# Patient Record
Sex: Female | Born: 1970 | Race: White | Hispanic: No | Marital: Married | Smoking: Current some day smoker
Health system: Southern US, Community
[De-identification: ages and names within clinical notes are randomized; demographics above are authoritative.]

## PROBLEM LIST (undated history)

## (undated) DIAGNOSIS — E785 Hyperlipidemia, unspecified: Secondary | ICD-10-CM

## (undated) DIAGNOSIS — E079 Disorder of thyroid, unspecified: Secondary | ICD-10-CM

## (undated) DIAGNOSIS — N6019 Diffuse cystic mastopathy of unspecified breast: Secondary | ICD-10-CM

## (undated) DIAGNOSIS — T7840XA Allergy, unspecified, initial encounter: Secondary | ICD-10-CM

## (undated) DIAGNOSIS — M94261 Chondromalacia, right knee: Secondary | ICD-10-CM

## (undated) DIAGNOSIS — F419 Anxiety disorder, unspecified: Secondary | ICD-10-CM

## (undated) HISTORY — DX: Chondromalacia, right knee: M94.261

## (undated) HISTORY — DX: Allergy, unspecified, initial encounter: T78.40XA

## (undated) HISTORY — DX: Anxiety disorder, unspecified: F41.9

## (undated) HISTORY — PX: TONSILLECTOMY AND ADENOIDECTOMY: SUR1326

## (undated) HISTORY — PX: ESSURE TUBAL LIGATION: SUR464

## (undated) HISTORY — DX: Diffuse cystic mastopathy of unspecified breast: N60.19

## (undated) HISTORY — DX: Hyperlipidemia, unspecified: E78.5

## (undated) HISTORY — DX: Disorder of thyroid, unspecified: E07.9

---

## 1997-05-20 ENCOUNTER — Other Ambulatory Visit: Admission: RE | Admit: 1997-05-20 | Discharge: 1997-05-20 | Payer: Self-pay | Admitting: Obstetrics and Gynecology

## 1998-05-26 ENCOUNTER — Other Ambulatory Visit: Admission: RE | Admit: 1998-05-26 | Discharge: 1998-05-26 | Payer: Self-pay | Admitting: Obstetrics and Gynecology

## 1999-03-30 ENCOUNTER — Encounter: Payer: Self-pay | Admitting: *Deleted

## 1999-03-30 ENCOUNTER — Encounter: Admission: RE | Admit: 1999-03-30 | Discharge: 1999-03-30 | Payer: Self-pay | Admitting: *Deleted

## 1999-05-25 ENCOUNTER — Other Ambulatory Visit: Admission: RE | Admit: 1999-05-25 | Discharge: 1999-05-25 | Payer: Self-pay | Admitting: Obstetrics and Gynecology

## 1999-12-11 ENCOUNTER — Inpatient Hospital Stay (HOSPITAL_COMMUNITY): Admission: AD | Admit: 1999-12-11 | Discharge: 1999-12-11 | Payer: Self-pay | Admitting: Physical Therapy

## 1999-12-16 ENCOUNTER — Inpatient Hospital Stay (HOSPITAL_COMMUNITY): Admission: AD | Admit: 1999-12-16 | Discharge: 1999-12-18 | Payer: Self-pay | Admitting: Obstetrics and Gynecology

## 2000-01-27 ENCOUNTER — Other Ambulatory Visit: Admission: RE | Admit: 2000-01-27 | Discharge: 2000-01-27 | Payer: Self-pay | Admitting: Obstetrics and Gynecology

## 2001-03-29 ENCOUNTER — Other Ambulatory Visit: Admission: RE | Admit: 2001-03-29 | Discharge: 2001-03-29 | Payer: Self-pay | Admitting: Obstetrics and Gynecology

## 2002-05-16 ENCOUNTER — Other Ambulatory Visit: Admission: RE | Admit: 2002-05-16 | Discharge: 2002-05-16 | Payer: Self-pay | Admitting: Obstetrics and Gynecology

## 2003-09-09 ENCOUNTER — Other Ambulatory Visit: Admission: RE | Admit: 2003-09-09 | Discharge: 2003-09-09 | Payer: Self-pay | Admitting: Obstetrics and Gynecology

## 2005-12-14 ENCOUNTER — Ambulatory Visit: Payer: Self-pay | Admitting: Family Medicine

## 2006-04-24 ENCOUNTER — Ambulatory Visit: Payer: Self-pay | Admitting: Family Medicine

## 2006-08-11 ENCOUNTER — Ambulatory Visit: Payer: Self-pay | Admitting: Family Medicine

## 2007-03-12 ENCOUNTER — Ambulatory Visit: Payer: Self-pay | Admitting: Family Medicine

## 2007-03-14 ENCOUNTER — Ambulatory Visit: Payer: Self-pay | Admitting: Family Medicine

## 2007-05-28 ENCOUNTER — Ambulatory Visit (HOSPITAL_COMMUNITY): Admission: RE | Admit: 2007-05-28 | Discharge: 2007-05-28 | Payer: Self-pay | Admitting: Obstetrics and Gynecology

## 2007-07-17 ENCOUNTER — Ambulatory Visit: Payer: Self-pay | Admitting: Family Medicine

## 2007-09-24 ENCOUNTER — Ambulatory Visit: Payer: Self-pay | Admitting: Family Medicine

## 2008-05-21 ENCOUNTER — Ambulatory Visit: Payer: Self-pay | Admitting: Family Medicine

## 2008-07-21 ENCOUNTER — Ambulatory Visit: Payer: Self-pay | Admitting: Family Medicine

## 2008-08-19 ENCOUNTER — Ambulatory Visit: Payer: Self-pay | Admitting: Family Medicine

## 2009-02-09 ENCOUNTER — Ambulatory Visit: Payer: Self-pay | Admitting: Family Medicine

## 2009-03-10 ENCOUNTER — Ambulatory Visit: Payer: Self-pay | Admitting: Family Medicine

## 2009-08-12 ENCOUNTER — Ambulatory Visit: Payer: Self-pay | Admitting: Family Medicine

## 2009-11-27 ENCOUNTER — Ambulatory Visit: Payer: Self-pay | Admitting: Family Medicine

## 2010-06-19 ENCOUNTER — Encounter: Payer: Self-pay | Admitting: Family Medicine

## 2010-07-15 ENCOUNTER — Ambulatory Visit (INDEPENDENT_AMBULATORY_CARE_PROVIDER_SITE_OTHER): Payer: 59 | Admitting: Family Medicine

## 2010-07-15 ENCOUNTER — Encounter: Payer: Self-pay | Admitting: Family Medicine

## 2010-07-15 VITALS — BP 114/80 | HR 64

## 2010-07-15 DIAGNOSIS — E039 Hypothyroidism, unspecified: Secondary | ICD-10-CM

## 2010-07-15 MED ORDER — LEVOTHYROXINE SODIUM 112 MCG PO TABS: 112.0000 ug | ORAL_TABLET | Freq: Every day | ORAL | Status: DC

## 2010-07-15 NOTE — Progress Notes (Signed)
  Subjective:    Patient ID: Kristina Larson, female    DOB: Jul 07, 1970, 40 y.o.   MRN: [id number]  HPI She is here for a medication check. She unfortunately has missed several weeks of thyroid medication. She has had difficulty recently with fatigue. She also has had a lot of stress dealing with the death of a relative as well as her work schedule being quite busy. The work is going quite well for her. Presently she is now on Wellbutrin and seems to doing quite nicely.   Review of Systems     Objective:   Physical Exam alert and in no distress otherwise not examined       Assessment & Plan:  Hypothyroid. I will renew her thyroid for the next several months and have her return in 2 months for blood work including a fasting panel.

## 2010-07-15 NOTE — Patient Instructions (Signed)
Take her thyroid daily and we'll get you back here in 2 months in the morning for a fasting blood sugar and thyroid function

## 2010-08-11 ENCOUNTER — Encounter: Payer: Self-pay | Admitting: Family Medicine

## 2010-08-11 ENCOUNTER — Ambulatory Visit (INDEPENDENT_AMBULATORY_CARE_PROVIDER_SITE_OTHER): Payer: 59 | Admitting: Family Medicine

## 2010-08-11 DIAGNOSIS — J329 Chronic sinusitis, unspecified: Secondary | ICD-10-CM

## 2010-08-11 DIAGNOSIS — F411 Generalized anxiety disorder: Secondary | ICD-10-CM

## 2010-08-11 MED ORDER — BUPROPION HCL ER (XL) 150 MG PO TB24: 150.0000 mg | ORAL_TABLET | Freq: Every day | ORAL | Status: DC

## 2010-08-11 MED ORDER — AMOXICILLIN-POT CLAVULANATE 875-125 MG PO TABS: 1.0000 | ORAL_TABLET | Freq: Two times a day (BID) | ORAL | Status: AC

## 2010-08-11 NOTE — Progress Notes (Signed)
Patient presents with complaint of sinusitis.  She has been having problems with nasal congestion, sinus pressure over the last month or two.  Using Zyrtec, Mucinex DM and Simply Saline but now he feels like she has such drainage in her throat that it makes her nauseated, gives her bad breath, and feels like she has a lump in her throat.  Mucus after sinus irrigation is discolored/green.  Having some chest congestion/wheezing, not coughing any phlegm up.  Last infection was October 2011, which was treated with Augmentin. Reports that z-pak and amoxacillin hasn't worked for her in the past.  Has been working hard on quitting smoking. She has been using the e-cigarette, but still admits to smoking 1-2 cigarettes/day at work.  Uses smoking as an excuse to sit down while at work.  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   Previously took Wellbutrin for Anxiety and depression, when caring for her mother prior to her death.  Has been off of it for less than a year.  Anxiety seems to be getting worse (ie when other people drive her child, worrying excessively over this), more emotional.  Going through counseling with her husband, going well.  Also has trouble sleeping at night (both falling asleep and staying asleep).  Review of chart shows she's been rx'd the Wellbutrin SR 150mg  BID, but she was only taking it once daily.  In the past had been on 300mg  of the XL.  Would like to restart wellbutrin, but only take it once daily.  Past Medical History  Diagnosis Date  . Allergy   . Thyroid disease     HYPO  . Anxiety   . Dyslipidemia   . Fibrocystic breast   . Chondromalacia of right knee    Past Surgical History  Procedure Date  . Tonsillectomy and adenoidectomy age 17  . Essure tubal ligation    No Known Allergies Current Outpatient Prescriptions on File Prior to Visit  Medication Sig Dispense Refill  . levothyroxine (SYNTHROID, LEVOTHROID) 112 MCG tablet Take 1 tablet (112 mcg total) by mouth  daily.  30 tablet  2  . DISCONTD: buPROPion (WELLBUTRIN XL) 150 MG 24 hr tablet Take 150 mg by mouth daily.         ROS: Denies fevers.  Denies vomiting or diarrhea.  Some slight eczema on face, which she states she tends to get with sinus infections. Denies SI  PHYSICAL EXAM: BP 120/78  Pulse 80  Temp 98.2 F (36.8 C)  Ht 5\' 5"  (1.651 m)  Wt 165 lb (74.844 kg)  BMI 27.46 kg/m2  LMP 07/14/2010 Well developed, pleasant female in no distress. No coughing HEENT: PERRL, EOMI, conjunctiva clear. TM's and EAC's normal.  OP normal.  Nasal mucosa mild-moderately edematous, L>R, with some yellow crusting. Sinuses nontender. Neck: no lymphadenopathy or thyromegaly Lungs: clear bilaterally Heart: regular rate and rhythm without murmurs Skin:  Few dry patches near mouth and neck Psych:  Normal mood, affect, hygiene, grooming; normal speech, eye contact  ASSESSMENT/PLAN: 1. Anxiety state, unspecified  buPROPion (WELLBUTRIN XL) 150 MG 24 hr tablet  2. Sinusitis  amoxicillin-clavulanate (AUGMENTIN) 875-125 MG per tablet  Discussed possibility of increasing Wellbutrin to 300mg  if only partially effective.  If inadequately treating the anxiety, then consider changing to SSRI rather than staying on Wellbutrin.  Will re-start Wellbutrin now, knowing that she has done well in the past with it.  F/u 4-6 weeks if needed (or when due for thyroid re-check)

## 2010-08-25 ENCOUNTER — Telehealth: Payer: Self-pay | Admitting: *Deleted

## 2010-08-25 DIAGNOSIS — J019 Acute sinusitis, unspecified: Secondary | ICD-10-CM

## 2010-08-25 MED ORDER — LEVOFLOXACIN 500 MG PO TABS: 500.0000 mg | ORAL_TABLET | Freq: Every day | ORAL | Status: DC

## 2010-08-25 NOTE — Telephone Encounter (Signed)
Patient had called yesterday stating that she thought she might need something else for her sinus infection. I called patient today and she stated that she only feels 20 % better and also recalls that she had taken Augmentin in the past and did not feel that she had gotten completely better that time either. Changed abx to Levaquin 500mg  #10 once daily for 10 days to [facility] [address] per Dr.Knapp.

## 2010-09-15 ENCOUNTER — Encounter: Payer: Self-pay | Admitting: Family Medicine

## 2010-09-15 ENCOUNTER — Ambulatory Visit (INDEPENDENT_AMBULATORY_CARE_PROVIDER_SITE_OTHER): Payer: 59 | Admitting: Family Medicine

## 2010-09-15 VITALS — BP 122/80 | HR 72 | Wt 168.0 lb

## 2010-09-15 DIAGNOSIS — J019 Acute sinusitis, unspecified: Secondary | ICD-10-CM

## 2010-09-15 DIAGNOSIS — J3081 Allergic rhinitis due to animal (cat) (dog) hair and dander: Secondary | ICD-10-CM

## 2010-09-15 DIAGNOSIS — Z79899 Other long term (current) drug therapy: Secondary | ICD-10-CM

## 2010-09-15 DIAGNOSIS — E039 Hypothyroidism, unspecified: Secondary | ICD-10-CM

## 2010-09-15 LAB — CBC WITH DIFFERENTIAL/PLATELET
Basophils Absolute: 0 10*3/uL (ref 0.0–0.1)
Eosinophils Relative: 5 % (ref 0–5)
HCT: 39.4 % (ref 36.0–46.0)
Hemoglobin: 13 g/dL (ref 12.0–15.0)
Lymphocytes Relative: 33 % (ref 12–46)
Lymphs Abs: 2.6 10*3/uL (ref 0.7–4.0)
MCV: 92.3 fL (ref 78.0–100.0)
Monocytes Absolute: 0.3 10*3/uL (ref 0.1–1.0)
Monocytes Relative: 4 % (ref 3–12)
Neutro Abs: 4.5 10*3/uL (ref 1.7–7.7)
RBC: 4.27 MIL/uL (ref 3.87–5.11)
RDW: 13.3 % (ref 11.5–15.5)
WBC: 7.8 10*3/uL (ref 4.0–10.5)

## 2010-09-15 LAB — TSH: TSH: 1.602 u[IU]/mL (ref 0.350–4.500)

## 2010-09-15 LAB — COMPREHENSIVE METABOLIC PANEL
AST: 18 U/L (ref 0–37)
Albumin: 4.3 g/dL (ref 3.5–5.2)
BUN: 20 mg/dL (ref 6–23)
CO2: 24 mEq/L (ref 19–32)
Calcium: 9 mg/dL (ref 8.4–10.5)
Chloride: 103 mEq/L (ref 96–112)
Potassium: 4.7 mEq/L (ref 3.5–5.3)

## 2010-09-15 LAB — LIPID PANEL
Cholesterol: 173 mg/dL (ref 0–200)
HDL: 48 mg/dL (ref >39)

## 2010-09-15 MED ORDER — MOMETASONE FUROATE 50 MCG/ACT NA SUSP: 2.0000 | Freq: Every day | NASAL | Status: DC

## 2010-09-15 MED ORDER — CEFUROXIME AXETIL 250 MG PO TABS: 250.0000 mg | ORAL_TABLET | Freq: Two times a day (BID) | ORAL | Status: AC

## 2010-09-15 NOTE — Patient Instructions (Signed)
Use the nasal steroid daily and the antibiotic. Call me at the end of weeks and let me know I don't

## 2010-09-15 NOTE — Progress Notes (Signed)
  Subjective:    Patient ID: Kristina Larson, female    DOB: 15-Apr-1970, 40 y.o.   MRN: [id number]  HPI She is here for followup on sinusitis. She was originally placed on Augmentin which got her 25% better. She was then switched to Levaquin and made no improvement. Her symptoms are now worsening again with sinus congestion, PND but no fever, chills or cough. She does not smoke. She does have a cat allergy however they now are in the garage. She continues on her present medication regimen for treatment of hypothyroid and is having no trouble with that.   Review of Systems     Objective:   Physical Exam alert and in no distress. Tympanic membranes and canals are normal. Throat is clear. Tonsils are normal. Neck is supple without adenopathy or thyromegaly. Cardiac exam shows a regular sinus rhythm without murmurs or gallops. Lungs are clear to auscultation. Nasal mucosa is normal in appearance however she is tender over frontal and ethmoid sinuses.        Assessment & Plan:  Unresolved sinusitis. Hypothyroid. Routine blood screening. I discussed options with her. She would like to not have to use oral steroids. I will place her on Nasonex. Will switch her to Ceftin which apparently has worked in the past. She will call me in 2 weeks.

## 2010-09-16 ENCOUNTER — Other Ambulatory Visit: Payer: Self-pay

## 2010-09-16 ENCOUNTER — Telehealth: Payer: Self-pay

## 2010-09-16 MED ORDER — LEVOTHYROXINE SODIUM 112 MCG PO TABS: 112.0000 ug | ORAL_TABLET | Freq: Every day | ORAL | Status: DC

## 2010-09-16 NOTE — Telephone Encounter (Signed)
callewd pt to inform her labs are normal left message

## 2010-12-02 ENCOUNTER — Other Ambulatory Visit: Payer: Self-pay | Admitting: Family Medicine

## 2010-12-02 ENCOUNTER — Telehealth: Payer: Self-pay | Admitting: Family Medicine

## 2010-12-02 ENCOUNTER — Encounter: Payer: Self-pay | Admitting: Family Medicine

## 2010-12-02 MED ORDER — LEVOTHYROXINE SODIUM 112 MCG PO TABS: 112.0000 ug | ORAL_TABLET | Freq: Every day | ORAL | Status: DC

## 2010-12-03 MED ORDER — LEVOTHYROXINE SODIUM 112 MCG PO TABS: 112.0000 ug | ORAL_TABLET | Freq: Every day | ORAL | Status: DC

## 2010-12-03 NOTE — Telephone Encounter (Signed)
Synthroid renewed 

## 2011-03-28 ENCOUNTER — Telehealth: Payer: Self-pay | Admitting: Family Medicine

## 2011-03-28 MED ORDER — MOMETASONE FUROATE 50 MCG/ACT NA SUSP: 2.0000 | Freq: Every day | NASAL | Status: DC

## 2011-03-28 MED ORDER — LEVOTHYROXINE SODIUM 112 MCG PO TABS: 112.0000 ug | ORAL_TABLET | Freq: Every day | ORAL | Status: DC

## 2011-03-28 NOTE — Telephone Encounter (Signed)
Refilled nasonex with 6 refills and refilled synthroid with 7 refills. Dr. Susann Givens refilled synthroid back in October for a year supply to the [facility] and pt wants it to [facility] so i just redid it 7 refills to [facility].

## 2011-04-13 ENCOUNTER — Other Ambulatory Visit: Payer: Self-pay | Admitting: Family Medicine

## 2011-05-23 ENCOUNTER — Encounter: Payer: Self-pay | Admitting: Medical

## 2011-05-23 ENCOUNTER — Other Ambulatory Visit: Payer: Self-pay | Admitting: Medical

## 2011-05-23 ENCOUNTER — Ambulatory Visit (INDEPENDENT_AMBULATORY_CARE_PROVIDER_SITE_OTHER): Payer: 59 | Admitting: Medical

## 2011-05-23 VITALS — BP 100/80 | HR 78 | Temp 98.3°F | Resp 16 | Wt 168.0 lb

## 2011-05-23 DIAGNOSIS — L089 Local infection of the skin and subcutaneous tissue, unspecified: Secondary | ICD-10-CM

## 2011-05-23 DIAGNOSIS — J329 Chronic sinusitis, unspecified: Secondary | ICD-10-CM

## 2011-05-23 DIAGNOSIS — L988 Other specified disorders of the skin and subcutaneous tissue: Secondary | ICD-10-CM

## 2011-05-23 DIAGNOSIS — R238 Other skin changes: Secondary | ICD-10-CM

## 2011-05-23 MED ORDER — SULFAMETHOXAZOLE-TRIMETHOPRIM 800-160 MG PO TABS: 1.0000 | ORAL_TABLET | Freq: Two times a day (BID) | ORAL | Status: AC

## 2011-05-23 NOTE — Progress Notes (Signed)
Subjective:   HPI  Kristina Larson is a 41 y.o. female who presents for sinus infection and staph infection.  She notes 2 week hx/o head and sinus pressure, headache, not able to get mucous out, using Simply Saline and getting blood tinged green mucous to some extent, feels clicking sounds in her sinuses, scratchy throat.  Denies fever, and doesn't normally get fever with sinusitis.  She does note some ear pressure, and recently [place] the [place] and this worsening the sinus pressure.  Has had some nausea.   Using sudafed, Nasonex and allergy pill.    She notes having a staph infection of her face in the past, has seen Dr. Susann Givens for this in the past.   Has used Doxycyline in the past for this, but this usually doesn't treat her sinus infections.  Currently has break out on the face that she says is like the prior staph infection.  No other aggravating or relieving factors.  Lesions usually recur in the same area, starts out as tingling and redness followed by clear filled pustule, and usually they heal and go away, but gets this recurrent.  No other c/o.  The following portions of the patient's history were reviewed and updated as appropriate: allergies, current medications, past family history, past medical history, past social history, past surgical history and problem list.  Past Medical History  Diagnosis Date  . Allergy   . Thyroid disease     HYPO  . Anxiety   . Dyslipidemia   . Fibrocystic breast   . Chondromalacia of right knee     No Known Allergies   Review of Systems ROS reviewed and was negative other than noted in HPI or above.    Objective:   Physical Exam  General appearance: alert, no distress, WD/WN Skin: right and left lower face inferior to lips with 2 patches of erythematous slightly crusted lesions, but obvious vesicle or crusting, but just erythema HEENT: normocephalic, sclerae anicteric, mild sinus tenderness, TMs with serous fluid, nares patent, no  discharge or erythema, pharynx normal Oral cavity: MMM, no lesions Neck: supple, no lymphadenopathy, no thyromegaly, no masses Heart: RRR, normal S1, S2, no murmurs Lungs: CTA bilaterally, no wheezes, rhonchi, or rales  Assessment and Plan :     Encounter Diagnoses  Name Primary?  . Sinusitis Yes  . Vesicles   . Skin infection     Sinusitis - Bactrim, c/t nasal saline, hydrate well ,OTC Mucinex  Vesicles/skin infection - exam nonspecific, lesions with erythema and early crusting, will check viral culture and labs to help rule out herpetic lesion vs bacterial.  We will assume bacteria. She thinks she has had MRSA in the past.  Script today with Bactrim.

## 2011-05-24 LAB — HSV(HERPES SIMPLEX VRS) I + II AB-IGM: Herpes Simplex Vrs I&II-IgM Ab (EIA): 0.44 INDEX

## 2011-05-24 LAB — HSV(HERPES SIMPLEX VRS) I + II AB-IGG: HSV 2 Glycoprotein G Ab, IgG: 8.9 IV — ABNORMAL HIGH

## 2011-05-26 LAB — HERPES SIMPLEX VIRUS CULTURE: Organism ID, Bacteria: NOT DETECTED

## 2011-05-27 ENCOUNTER — Other Ambulatory Visit: Payer: Self-pay | Admitting: Medical

## 2011-05-27 MED ORDER — MUPIROCIN 2 % EX OINT: TOPICAL_OINTMENT | Freq: Three times a day (TID) | CUTANEOUS | Status: AC

## 2011-05-30 ENCOUNTER — Other Ambulatory Visit: Payer: Self-pay | Admitting: Medical

## 2011-05-30 ENCOUNTER — Telehealth: Payer: Self-pay | Admitting: Internal Medicine

## 2011-05-30 MED ORDER — CEFUROXIME AXETIL 500 MG PO TABS: 500.0000 mg | ORAL_TABLET | Freq: Two times a day (BID) | ORAL | Status: AC

## 2011-05-30 NOTE — Telephone Encounter (Signed)
ceftin sent

## 2011-05-30 NOTE — Telephone Encounter (Signed)
LMOM NOTIFYING THE PATIENT THAT SHANE TYSINGER PA-C SENT IN A RX TO HIS PHARMACY. CLS

## 2011-07-08 ENCOUNTER — Other Ambulatory Visit: Payer: Self-pay | Admitting: Family Medicine

## 2011-10-07 ENCOUNTER — Other Ambulatory Visit: Payer: Self-pay | Admitting: Family Medicine

## 2011-10-31 ENCOUNTER — Other Ambulatory Visit: Payer: Self-pay | Admitting: Family Medicine

## 2012-01-06 ENCOUNTER — Other Ambulatory Visit: Payer: Self-pay | Admitting: Family Medicine

## 2012-06-13 ENCOUNTER — Other Ambulatory Visit: Payer: Self-pay | Admitting: Family Medicine

## 2016-07-15 ENCOUNTER — Other Ambulatory Visit: Payer: Self-pay | Admitting: Obstetrics and Gynecology

## 2016-07-15 DIAGNOSIS — R928 Other abnormal and inconclusive findings on diagnostic imaging of breast: Secondary | ICD-10-CM

## 2016-07-19 ENCOUNTER — Ambulatory Visit
Admission: RE | Admit: 2016-07-19 | Discharge: 2016-07-19 | Disposition: A | Discharge status: Home / Self Care | Payer: BLUE CROSS/BLUE SHIELD | Source: Ambulatory Visit | Attending: Obstetrics and Gynecology | Admitting: Obstetrics and Gynecology

## 2016-07-19 DIAGNOSIS — R928 Other abnormal and inconclusive findings on diagnostic imaging of breast: Secondary | ICD-10-CM

## 2017-12-04 ENCOUNTER — Other Ambulatory Visit: Payer: Self-pay | Admitting: Obstetrics and Gynecology

## 2017-12-04 DIAGNOSIS — N6320 Unspecified lump in the left breast, unspecified quadrant: Secondary | ICD-10-CM

## 2017-12-08 ENCOUNTER — Ambulatory Visit
Admission: RE | Admit: 2017-12-08 | Discharge: 2017-12-08 | Disposition: A | Discharge status: Home / Self Care | Payer: BLUE CROSS/BLUE SHIELD | Source: Ambulatory Visit | Attending: Obstetrics and Gynecology | Admitting: Obstetrics and Gynecology

## 2017-12-08 DIAGNOSIS — N6320 Unspecified lump in the left breast, unspecified quadrant: Secondary | ICD-10-CM

## 2018-04-25 DIAGNOSIS — R519 Headache, unspecified: Secondary | ICD-10-CM | POA: Insufficient documentation

## 2018-10-29 ENCOUNTER — Ambulatory Visit: Payer: BLUE CROSS/BLUE SHIELD | Admitting: Orthopedic Surgery

## 2018-11-05 ENCOUNTER — Encounter: Payer: Self-pay | Admitting: Orthopedic Surgery

## 2018-11-05 ENCOUNTER — Ambulatory Visit: Payer: Self-pay

## 2018-11-05 ENCOUNTER — Other Ambulatory Visit: Payer: Self-pay

## 2018-11-05 ENCOUNTER — Ambulatory Visit (INDEPENDENT_AMBULATORY_CARE_PROVIDER_SITE_OTHER): Payer: BC Managed Care – PPO | Admitting: Orthopedic Surgery

## 2018-11-05 VITALS — Ht 65.0 in | Wt 168.0 lb

## 2018-11-05 DIAGNOSIS — G8929 Other chronic pain: Secondary | ICD-10-CM | POA: Diagnosis not present

## 2018-11-05 DIAGNOSIS — M25562 Pain in left knee: Secondary | ICD-10-CM | POA: Diagnosis not present

## 2018-11-05 DIAGNOSIS — M25561 Pain in right knee: Secondary | ICD-10-CM

## 2018-11-05 DIAGNOSIS — M5442 Lumbago with sciatica, left side: Secondary | ICD-10-CM

## 2018-11-05 NOTE — Progress Notes (Signed)
Office Visit Note   Patient: Kristina Larson           Date of Birth: Aug 07, 1970           MRN: [id number] Visit Date: 11/05/2018              Requested by: No referring provider defined for this encounter. PCP: Patient, No Pcp Per  Chief Complaint  Patient presents with  . Right Knee - Pain  . Left Knee - Pain  . Lower Back - Pain      HPI: Patient is a 48 year old woman who has stiffness and crepitation in both knees she states she feels like the knee may give way with going down stairs.  She states she has had temporary relief with steroid injections in the past and is currently taking 3 Aleve a day and is working on Hotel manager.  Patient also has complains of pain over the SI joint on the left side states she occasionally has some pain into the buttocks but no radiating pain further.  Denies any low midline lower back pain.  Assessment & Plan: Visit Diagnoses:  1. Chronic left-sided low back pain with left-sided sciatica   2. Chronic pain of both knees     Plan: Patient was given instructions to continue working on VMO strengthening for her knees to improve the patella tracking.  She can continue with the anti-inflammatories as needed.  Also advised against doing her extension dead lift exercises as this will exacerbate the lumbar spine and SI joint.  Continue with core strengthening and VMO strengthening.  Follow-Up Instructions: Return if symptoms worsen or fail to improve.   Ortho Exam  Patient is alert, oriented, no adenopathy, well-dressed, normal affect, normal respiratory effort. Examination patient has a normal gait she has a negative sciatic tension sign bilaterally.  No focal motor weakness in either lower extremity.  Both knees have some crepitation with range of motion in the patellofemoral joint medial lateral joint lines are minimally tender to palpation collaterals and cruciates are stable.  Left knee she has some tenderness to palpation along the iliotibial  band.  Imaging: Xr Knee 1-2 Views Left  Result Date: 11/05/2018 2 view radiographs of the left knee shows medial joint line narrowing subchondral sclerosis and mild bony spur of the patella.  Xr Knee 1-2 Views Right  Result Date: 11/05/2018 2 view radiographs of the right knee shows mild medial joint line narrowing with small bony spur in the patellofemoral joint.  Xr Lumbar Spine 2-3 Views  Result Date: 11/05/2018 2 view radiographs of the lumbar spine show some mild joint space narrowing SI joints appear congruent there is a small defect of the posterior aspect at L5.  No images are attached to the encounter.  Labs: Lab Results  Component Value Date   LABORGA No Herpes Simplex Virus detected. 05/23/2011     Lab Results  Component Value Date   ALBUMIN 4.3 09/15/2010    No results found for: MG No results found for: VD25OH  No results found for: PREALBUMIN CBC EXTENDED Latest Ref Rng & Units 09/15/2010  WBC 4.0 - 10.5 K/uL 7.8  RBC 3.87 - 5.11 MIL/uL 4.27  HGB 12.0 - 15.0 g/dL 13.0  HCT 36.0 - 46.0 % 39.4  PLT 150 - 400 K/uL 299  NEUTROABS 1.7 - 7.7 K/uL 4.5  LYMPHSABS 0.7 - 4.0 K/uL 2.6     Body mass index is 27.96 kg/m.  Orders:  Orders Placed This Encounter  Procedures  . XR Knee 1-2 Views Right  . XR Knee 1-2 Views Left  . XR Lumbar Spine 2-3 Views   No orders of the defined types were placed in this encounter.    Procedures: No procedures performed  Clinical Data: No additional findings.  ROS:  All other systems negative, except as noted in the HPI. Review of Systems  Objective: Vital Signs: Ht 5\' 5"  (1.651 m)   Wt 168 lb (76.2 kg)   BMI 27.96 kg/m   Specialty Comments:  No specialty comments available.  PMFS History: There are no active problems to display for this patient.  Past Medical History:  Diagnosis Date  . Allergy   . Anxiety   . Chondromalacia of right knee   . Dyslipidemia   . Fibrocystic breast   . Thyroid disease     HYPO    Family History  Problem Relation Age of Onset  . Breast cancer Maternal Aunt     Past Surgical History:  Procedure Laterality Date  . ESSURE TUBAL LIGATION    . TONSILLECTOMY AND ADENOIDECTOMY  age 48   Social History   Occupational History  . Not on file  Tobacco Use  . Smoking status: Current Some Day Smoker    Types: Cigarettes  . Smokeless tobacco: Never Used  Substance and Sexual Activity  . Alcohol use: Yes    Comment: 5-10 drinks per week.  . Drug use: No  . Sexual activity: Not on file

## 2018-12-31 ENCOUNTER — Other Ambulatory Visit: Payer: Self-pay | Admitting: Obstetrics and Gynecology

## 2018-12-31 DIAGNOSIS — R928 Other abnormal and inconclusive findings on diagnostic imaging of breast: Secondary | ICD-10-CM

## 2019-01-03 ENCOUNTER — Ambulatory Visit
Admission: RE | Admit: 2019-01-03 | Discharge: 2019-01-03 | Disposition: A | Discharge status: Home / Self Care | Payer: BC Managed Care – PPO | Source: Ambulatory Visit | Attending: Obstetrics and Gynecology | Admitting: Obstetrics and Gynecology

## 2019-01-03 ENCOUNTER — Other Ambulatory Visit: Payer: Self-pay

## 2019-01-03 DIAGNOSIS — R928 Other abnormal and inconclusive findings on diagnostic imaging of breast: Secondary | ICD-10-CM

## 2019-05-23 ENCOUNTER — Ambulatory Visit: Payer: BC Managed Care – PPO | Attending: Internal Medicine

## 2019-05-23 DIAGNOSIS — Z23 Encounter for immunization: Secondary | ICD-10-CM

## 2019-05-23 NOTE — Progress Notes (Signed)
   Covid-19 Vaccination Clinic  Name:  KATILYNN SINKLER    MRN: [id number] DOB: 1970-06-30  05/23/2019  Ms. Trimmer was observed post Covid-19 immunization for 15 minutes without incident. She was provided with Vaccine Information Sheet and instruction to access the V-Safe system.   Ms. Azizi was instructed to call 911 with any severe reactions post vaccine: Marland Kitchen Difficulty breathing  . Swelling of face and throat  . A fast heartbeat  . A bad rash all over body  . Dizziness and weakness   Immunizations Administered    Name Date Dose VIS Date Route   Pfizer COVID-19 Vaccine 05/23/2019  8:49 AM 0.3 mL 01/25/2019 Intramuscular   Manufacturer: [employer], [employer]   Lot: [id number]   NDC: [address]-4

## 2019-06-24 ENCOUNTER — Ambulatory Visit: Payer: BC Managed Care – PPO | Attending: Internal Medicine

## 2019-06-24 DIAGNOSIS — Z23 Encounter for immunization: Secondary | ICD-10-CM

## 2019-06-24 NOTE — Progress Notes (Signed)
   Covid-19 Vaccination Clinic  Name:  ROSELINDA BAHENA    MRN: [id number] DOB: 08/09/1970  06/24/2019  Ms. Desrocher was observed post Covid-19 immunization for 15 minutes without incident. She was provided with Vaccine Information Sheet and instruction to access the V-Safe system.   Ms. Patty was instructed to call 911 with any severe reactions post vaccine: Marland Kitchen Difficulty breathing  . Swelling of face and throat  . A fast heartbeat  . A bad rash all over body  . Dizziness and weakness   Immunizations Administered    Name Date Dose VIS Date Route   Pfizer COVID-19 Vaccine 06/24/2019  8:16 AM 0.3 mL 04/10/2018 Intramuscular   Manufacturer: [employer], [employer]   Lot: [id number]   NDC: [address]-9

## 2019-12-25 ENCOUNTER — Other Ambulatory Visit: Payer: Self-pay | Admitting: Obstetrics and Gynecology

## 2019-12-25 DIAGNOSIS — Z1231 Encounter for screening mammogram for malignant neoplasm of breast: Secondary | ICD-10-CM

## 2020-02-20 ENCOUNTER — Ambulatory Visit: Payer: BC Managed Care – PPO

## 2020-03-19 DIAGNOSIS — E039 Hypothyroidism, unspecified: Secondary | ICD-10-CM | POA: Insufficient documentation

## 2020-04-06 ENCOUNTER — Inpatient Hospital Stay: Admission: RE | Admit: 2020-04-06 | Payer: BC Managed Care – PPO | Source: Ambulatory Visit

## 2020-05-25 ENCOUNTER — Ambulatory Visit
Admission: RE | Admit: 2020-05-25 | Discharge: 2020-05-25 | Disposition: A | Discharge status: Home / Self Care | Payer: BC Managed Care – PPO | Source: Ambulatory Visit | Attending: Obstetrics and Gynecology | Admitting: Obstetrics and Gynecology

## 2020-05-25 ENCOUNTER — Other Ambulatory Visit: Payer: Self-pay

## 2020-05-25 ENCOUNTER — Other Ambulatory Visit: Payer: Self-pay | Admitting: Obstetrics and Gynecology

## 2020-05-25 DIAGNOSIS — N63 Unspecified lump in unspecified breast: Secondary | ICD-10-CM

## 2020-05-25 DIAGNOSIS — Z1231 Encounter for screening mammogram for malignant neoplasm of breast: Secondary | ICD-10-CM

## 2020-12-03 ENCOUNTER — Ambulatory Visit: Payer: BC Managed Care – PPO | Admitting: Orthopedic Surgery

## 2020-12-04 ENCOUNTER — Other Ambulatory Visit: Payer: Self-pay | Admitting: Obstetrics and Gynecology

## 2020-12-04 DIAGNOSIS — Z1231 Encounter for screening mammogram for malignant neoplasm of breast: Secondary | ICD-10-CM

## 2020-12-16 ENCOUNTER — Other Ambulatory Visit: Payer: Self-pay | Admitting: Obstetrics and Gynecology

## 2020-12-16 DIAGNOSIS — K439 Ventral hernia without obstruction or gangrene: Secondary | ICD-10-CM

## 2021-01-05 ENCOUNTER — Other Ambulatory Visit: Payer: BC Managed Care – PPO

## 2021-01-11 ENCOUNTER — Other Ambulatory Visit: Payer: Self-pay

## 2021-01-11 ENCOUNTER — Ambulatory Visit
Admission: RE | Admit: 2021-01-11 | Discharge: 2021-01-11 | Disposition: A | Discharge status: Home / Self Care | Payer: BC Managed Care – PPO | Source: Ambulatory Visit | Attending: Obstetrics and Gynecology | Admitting: Obstetrics and Gynecology

## 2021-01-11 DIAGNOSIS — Z1231 Encounter for screening mammogram for malignant neoplasm of breast: Secondary | ICD-10-CM

## 2021-02-23 ENCOUNTER — Other Ambulatory Visit: Payer: Self-pay

## 2021-02-23 ENCOUNTER — Ambulatory Visit
Admission: RE | Admit: 2021-02-23 | Discharge: 2021-02-23 | Disposition: A | Discharge status: Home / Self Care | Payer: BC Managed Care – PPO | Source: Ambulatory Visit | Attending: Obstetrics and Gynecology | Admitting: Obstetrics and Gynecology

## 2021-02-23 DIAGNOSIS — K439 Ventral hernia without obstruction or gangrene: Secondary | ICD-10-CM

## 2021-02-23 MED ORDER — IOPAMIDOL (ISOVUE-300) INJECTION 61%
100.0000 mL | Freq: Once | INTRAVENOUS | Status: AC | PRN
  Administered 2021-02-23: 100 mL via INTRAVENOUS

## 2021-03-16 ENCOUNTER — Ambulatory Visit: Payer: Self-pay | Admitting: Surgery

## 2021-03-16 DIAGNOSIS — K4020 Bilateral inguinal hernia, without obstruction or gangrene, not specified as recurrent: Secondary | ICD-10-CM | POA: Insufficient documentation

## 2021-05-19 ENCOUNTER — Ambulatory Visit: Payer: Self-pay

## 2021-05-19 ENCOUNTER — Ambulatory Visit (INDEPENDENT_AMBULATORY_CARE_PROVIDER_SITE_OTHER): Payer: BC Managed Care – PPO | Admitting: Family

## 2021-05-19 DIAGNOSIS — M25512 Pain in left shoulder: Secondary | ICD-10-CM

## 2021-11-03 NOTE — Progress Notes (Signed)
Office Visit Note   Patient: Kristina Larson           Date of Birth: 09/25/1970           MRN: [id number] Visit Date: 05/19/2021              Requested by: Louretta Shorten, MD [place], [place] [place],  [place] [address] PCP: Louretta Shorten, MD  Chief Complaint  Patient presents with   Left Shoulder - Pain      HPI: The patient is a 51 year old woman who presents today complaining of left shoulder pain she had a fall over the weekend she fell down 1 step on Saturday while trying to catch herself her left arm to drain direct impact pushing backwards.  She feels she has had limited range of motion of the left upper extremity she is able unable to raise her arm up above her head she has been using Aleve and using ice with moderate relief of her pain  Assessment & Plan: Visit Diagnoses:  1. Acute pain of left shoulder     Plan: Offered Depo-Medrol injection of the left shoulder today.  Patient tolerated well.  She may continue with her anti-inflammatories work on range of motion follow-up in the office in 4 weeks as needed  Follow-Up Instructions: No follow-ups on file.   Left Shoulder Exam   Tenderness  The patient is experiencing tenderness in the biceps tendon.  Range of Motion  Passive abduction:  normal   Tests  Impingement: positive Drop arm: negative  Other  Pulse: present       Patient is alert, oriented, no adenopathy, well-dressed, normal affect, normal respiratory effort.   Imaging: No results found. No images are attached to the encounter.  Labs: Lab Results  Component Value Date   LABORGA No Herpes Simplex Virus detected. 05/23/2011     Lab Results  Component Value Date   ALBUMIN 4.3 09/15/2010    No results found for: "MG" No results found for: "VD25OH"  No results found for: "PREALBUMIN"    Latest Ref Rng & Units 09/15/2010    8:48 AM  CBC EXTENDED  WBC 4.0 - 10.5 K/uL 7.8   RBC 3.87 - 5.11 MIL/uL 4.27   Hemoglobin 12.0 -  15.0 g/dL 13.0   HCT 36.0 - 46.0 % 39.4   Platelets 150 - 400 K/uL 299   NEUT# 1.7 - 7.7 K/uL 4.5   Lymph# 0.7 - 4.0 K/uL 2.6      There is no height or weight on file to calculate BMI.  Orders:  Orders Placed This Encounter  Procedures   XR Shoulder Left   No orders of the defined types were placed in this encounter.    Procedures: No procedures performed  Clinical Data: No additional findings.  ROS:  All other systems negative, except as noted in the HPI. Review of Systems  Objective: Vital Signs: LMP 12/16/2018   Specialty Comments:  No specialty comments available.  PMFS History: There are no problems to display for this patient.  Past Medical History:  Diagnosis Date   Allergy    Anxiety    Chondromalacia of right knee    Dyslipidemia    Fibrocystic breast    Thyroid disease    HYPO    Family History  Problem Relation Age of Onset   Breast cancer Maternal Aunt        4s    Past Surgical History:  Procedure Laterality Date  ESSURE TUBAL LIGATION     TONSILLECTOMY AND ADENOIDECTOMY  age 72   Social History   Occupational History   Not on file  Tobacco Use   Smoking status: Some Days    Types: Cigarettes   Smokeless tobacco: Never  Substance and Sexual Activity   Alcohol use: Yes    Comment: 5-10 drinks per week.   Drug use: No   Sexual activity: Not on file

## 2021-11-10 ENCOUNTER — Ambulatory Visit: Payer: Self-pay

## 2021-11-10 ENCOUNTER — Ambulatory Visit (INDEPENDENT_AMBULATORY_CARE_PROVIDER_SITE_OTHER): Payer: BC Managed Care – PPO

## 2021-11-10 ENCOUNTER — Ambulatory Visit (INDEPENDENT_AMBULATORY_CARE_PROVIDER_SITE_OTHER): Payer: BC Managed Care – PPO | Admitting: Family

## 2021-11-10 DIAGNOSIS — M79672 Pain in left foot: Secondary | ICD-10-CM

## 2021-11-10 DIAGNOSIS — M5416 Radiculopathy, lumbar region: Secondary | ICD-10-CM

## 2021-11-10 DIAGNOSIS — M79671 Pain in right foot: Secondary | ICD-10-CM

## 2021-11-10 NOTE — Progress Notes (Signed)
Office Visit Note   Patient: Kristina Larson           Date of Birth: 1970/12/14           MRN: [id number] Visit Date: 11/10/2021              Requested by: Louretta Shorten, MD [place], [place] [place],  [place] [address] PCP: Louretta Shorten, MD  Chief Complaint  Patient presents with   Right Foot - Pain   Left Foot - Pain      HPI: The patient is a 51 year old woman who presents today complaining of bilateral foot pain left worse than right.  She complains of pain over the dorsum of her forefoot this radiates up to the ankle she is having some anterior ankle pain especially laterally this worsens over the course of the day.  Goes on to relate having burning pain in the left posterior thigh does have a history of sciatica however this feels different than previous episodes.  No numbness no weakness no red flag symptoms  Assessment & Plan: Visit Diagnoses:  1. Bilateral foot pain   2. Radiculopathy, lumbar region     Plan: Impression lumbar radiculopathy on the left.  Depo-Medrol IM, patient would like to avoid taking oral prednisone..  Follow-up in the office in 4 weeks as needed.  Consider MRI versus ESI.  Follow-Up Instructions: No follow-ups on file.   Right Ankle Exam  Right ankle exam is normal.    Left Ankle Exam  Left ankle exam is normal.   Back Exam   Tenderness  The patient is experiencing no tenderness.   Muscle Strength  The patient has normal back strength.  Tests  Straight leg raise left: positive      Patient is alert, oriented, no adenopathy, well-dressed, normal affect, normal respiratory effort. On examination bilateral feet feet are plantigrade there are no areas of edema the plantar fascia is nontender there is no pain with lateral compression of the metatarsal heads no neuroma type pain or tenderness.  Imaging: No results found. No images are attached to the encounter.  Labs: Lab Results  Component Value Date   LABORGA No  Herpes Simplex Virus detected. 05/23/2011     Lab Results  Component Value Date   ALBUMIN 4.3 09/15/2010    No results found for: "MG" No results found for: "VD25OH"  No results found for: "PREALBUMIN"    Latest Ref Rng & Units 09/15/2010    8:48 AM  CBC EXTENDED  WBC 4.0 - 10.5 K/uL 7.8   RBC 3.87 - 5.11 MIL/uL 4.27   Hemoglobin 12.0 - 15.0 g/dL 13.0   HCT 36.0 - 46.0 % 39.4   Platelets 150 - 400 K/uL 299   NEUT# 1.7 - 7.7 K/uL 4.5   Lymph# 0.7 - 4.0 K/uL 2.6      There is no height or weight on file to calculate BMI.  Orders:  Orders Placed This Encounter  Procedures   XR Foot 2 Views Right   XR Foot 2 Views Left   XR Lumbar Spine 2-3 Views   No orders of the defined types were placed in this encounter.    Procedures: No procedures performed  Clinical Data: No additional findings.  ROS:  All other systems negative, except as noted in the HPI. Review of Systems  Objective: Vital Signs: LMP 12/16/2018   Specialty Comments:  No specialty comments available.  PMFS History: There are no problems to display  for this patient.  Past Medical History:  Diagnosis Date   Allergy    Anxiety    Chondromalacia of right knee    Dyslipidemia    Fibrocystic breast    Thyroid disease    HYPO    Family History  Problem Relation Age of Onset   Breast cancer Maternal Aunt        109s    Past Surgical History:  Procedure Laterality Date   ESSURE TUBAL LIGATION     TONSILLECTOMY AND ADENOIDECTOMY  age 75   Social History   Occupational History   Not on file  Tobacco Use   Smoking status: Some Days    Types: Cigarettes   Smokeless tobacco: Never  Substance and Sexual Activity   Alcohol use: Yes    Comment: 5-10 drinks per week.   Drug use: No   Sexual activity: Not on file

## 2021-11-24 ENCOUNTER — Other Ambulatory Visit: Payer: Self-pay

## 2021-11-24 ENCOUNTER — Telehealth: Payer: Self-pay | Admitting: Family

## 2021-11-24 DIAGNOSIS — M5416 Radiculopathy, lumbar region: Secondary | ICD-10-CM

## 2021-11-24 DIAGNOSIS — M79671 Pain in right foot: Secondary | ICD-10-CM

## 2021-11-24 NOTE — Telephone Encounter (Signed)
I called and sw pt and she advised that the injection that Erin did in the office was very helpful. She states that she does have some continued pain but much improved. Pt would like to proceed with the MRI and ESI with FN. Ok to order per Junie Panning and have follow up of the MRI with FN.

## 2021-11-24 NOTE — Telephone Encounter (Signed)
Patient called. Would like Erin to call her. She does not remember the procedure that Junie Panning spoke to her about having. Her call back number is [phone]

## 2021-12-08 ENCOUNTER — Ambulatory Visit
Admission: RE | Admit: 2021-12-08 | Discharge: 2021-12-08 | Disposition: A | Discharge status: Home / Self Care | Payer: BC Managed Care – PPO | Source: Ambulatory Visit | Attending: Family | Admitting: Family

## 2021-12-08 DIAGNOSIS — M79671 Pain in right foot: Secondary | ICD-10-CM

## 2021-12-08 DIAGNOSIS — M5416 Radiculopathy, lumbar region: Secondary | ICD-10-CM

## 2021-12-10 ENCOUNTER — Ambulatory Visit (INDEPENDENT_AMBULATORY_CARE_PROVIDER_SITE_OTHER): Payer: BC Managed Care – PPO | Admitting: Physical Medicine and Rehabilitation

## 2021-12-10 ENCOUNTER — Encounter: Payer: Self-pay | Admitting: Physical Medicine and Rehabilitation

## 2021-12-10 VITALS — BP 135/85 | HR 71

## 2021-12-10 DIAGNOSIS — M4726 Other spondylosis with radiculopathy, lumbar region: Secondary | ICD-10-CM

## 2021-12-10 DIAGNOSIS — Q068 Other specified congenital malformations of spinal cord: Secondary | ICD-10-CM | POA: Diagnosis not present

## 2021-12-10 DIAGNOSIS — M5416 Radiculopathy, lumbar region: Secondary | ICD-10-CM | POA: Diagnosis not present

## 2021-12-10 DIAGNOSIS — M47816 Spondylosis without myelopathy or radiculopathy, lumbar region: Secondary | ICD-10-CM

## 2021-12-10 NOTE — Progress Notes (Unsigned)
Numeric Pain Rating Scale and Functional Assessment Average Pain 3   In the last MONTH (on 0-10 scale) has pain interfered with the following?  1. General activity like being  able to carry out your everyday physical activities such as walking, climbing stairs, carrying groceries, or moving a chair?  Rating(8)    General activity makes pain worse

## 2021-12-10 NOTE — Progress Notes (Unsigned)
Kristina Larson - 51 y.o. female MRN [id number]  Date of birth: [birth year]  Office Visit Note: Visit Date: 12/10/2021 PCP: Louretta Shorten, MD Referred by: Suzan Slick, NP  Subjective: Chief Complaint  Patient presents with   Lower Back - Pain   HPI: Kristina Larson is a 51 y.o. female who comes in today per the request of Dondra Prader, NP for evaluation of chronic, worsening and severe left sided lower back pain radiating to buttock down lateral leg to foot. She does reports numbness/tingling to foot. Pain ongoing for 20 plus years, worsened over the last several months. She describes pain as sore, aching and tingling, currently rates as 7 out of 10. Some relief of pain with home exercise regimen, rest and use of medications. 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 We also noted slight left lateral disc protrusion at the level of L5 that is not dictated in report. No high grade spinal canal stenosis. Recent lumbar x-ray imaging does exhibit possible congential defect at L5, suggestive of spina bifida occulta. No history of lumbar surgery/injection. Patient recently received intramuscular injection of Depo-Medrol by Dondra Prader, NP and reports some relief of pain with this procedure. 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 Patient denies focal weakness. Patient denies recent trauma or falls.    Review of Systems  Musculoskeletal:  Positive for back pain.  Neurological:  Positive for tingling. Negative for focal weakness and weakness.  All other systems reviewed and are negative.  Otherwise per HPI.  Assessment & Plan: Visit Diagnoses:    ICD-10-CM   1. Lumbar radiculopathy  M54.16 Ambulatory referral to Physical Medicine Rehab    2. Other spondylosis with radiculopathy, lumbar region  M47.26 Ambulatory referral to  Physical Medicine Rehab    3. Facet arthropathy, lumbar  M47.816 Ambulatory referral to Physical Medicine Rehab    4. Tethered cord syndrome (HCC)  Q06.8 Ambulatory referral to Physical Medicine Rehab       Plan: Findings:  Chronic, worsening and severe left sided lower back pain radiating to buttock down lateral leg to foot. Patient continues to have severe pain despite good conservative therapies such as home exercise regimen, rest and use of medications. I did discuss recent lumbar MRI with patient today in detail using imaging and spine model. Patients clinical presentation and exam are consistent with L5 nerve pattern. There is slight left lateral disc protrusion noted at the level of L5 on recent lumbar MRI imaging. Next step is to perform diagnostic and hopefully therapeutic left L5 transforaminal epidural steroid injection under fluoroscopic guidance. If good relief of pain with injection we can repeat this procedure infrequently as needed. If pain persists I did discuss possibility of regrouping with physical therapy. Patient encouraged to remain active as tolerated. No red flag symptoms noted upon exam today.     Meds & Orders: No orders of the defined types were placed in this encounter.   Orders Placed This Encounter  Procedures   Ambulatory referral to Physical Medicine Rehab    Follow-up: Return for Left L5 transforaminal epidural steroid injection.   Procedures: No procedures performed      Clinical History: EXAM: MRI LUMBAR SPINE WITHOUT CONTRAST   TECHNIQUE: Multiplanar, multisequence MR imaging of the lumbar spine was performed. No intravenous contrast was administered.   COMPARISON:  None Available.   FINDINGS:  Segmentation:  Standard.   Alignment:  Physiologic.   Vertebrae: No fracture, evidence of discitis, or bone lesion. Posterior arches of the sacrum are difficult to visualize/gracile an incompletely formed by abdominal CT 08/23/2021.   Conus  medullaris and cauda equina: Conus extends to the L5-S1 level. Tethering fatty mass at the level of S2/3, simple appearing by CT and sagittal MR coverage, consistent with 2.5 cm lipoma.   Paraspinal and other soft tissues: Negative for visible presacral mass.   Disc levels:   Diffusely preserved disc height and hydration. Mild facet spurring at L5-S1 and L4-5 on the right. Minor disc bulging towards the left at L5-S1.   IMPRESSION: 1. 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 2. Early degenerative changes without impingement.     Electronically Signed   By: Tiburcio Pea M.D.   On: 12/09/2021 19:08   She reports that she has been smoking cigarettes. She has never used smokeless tobacco. No results for input(s): "HGBA1C", "LABURIC" in the last 8760 hours.  Objective:  VS:  HT:    WT:   BMI:     BP:135/85  HR:71bpm  TEMP: ( )  RESP:  Physical Exam Vitals and nursing note reviewed.  HENT:     Head: Normocephalic and atraumatic.     Right Ear: External ear normal.     Left Ear: External ear normal.     Nose: Nose normal.     Mouth/Throat:     Mouth: Mucous membranes are moist.  Eyes:     Extraocular Movements: Extraocular movements intact.  Cardiovascular:     Rate and Rhythm: Normal rate.     Pulses: Normal pulses.  Pulmonary:     Effort: Pulmonary effort is normal.  Abdominal:     General: Abdomen is flat. There is no distension.  Musculoskeletal:        General: Tenderness present.     Cervical back: Normal range of motion.     Comments: Pt rises from seated position to standing without difficulty. Good lumbar range of motion. Strong distal strength without clonus, no pain upon palpation of greater trochanters. Sensation intact bilaterally. Dysesthesias noted to left L5 dermatome. Walks independently, gait steady. Positive slump test on the left.   Skin:    General: Skin is warm and dry.     Capillary Refill: Capillary refill takes less  than 2 seconds.  Neurological:     General: No focal deficit present.     Mental Status: She is alert and oriented to person, place, and time.  Psychiatric:        Mood and Affect: Mood normal.        Behavior: Behavior normal.     Ortho Exam  Imaging: No results found.  Past Medical/Family/Surgical/Social History: Medications & Allergies reviewed per EMR, new medications updated. There are no problems to display for this patient.  Past Medical History:  Diagnosis Date   Allergy    Anxiety    Chondromalacia of right knee    Dyslipidemia    Fibrocystic breast    Thyroid disease    HYPO   Family History  Problem Relation Age of Onset   Breast cancer Maternal Aunt        69s   Past Surgical History:  Procedure Laterality Date   ESSURE TUBAL LIGATION     TONSILLECTOMY AND ADENOIDECTOMY  age 41   Social History   Occupational History   Not on file  Tobacco Use  Smoking status: Some Days    Types: Cigarettes   Smokeless tobacco: Never  Substance and Sexual Activity   Alcohol use: Yes    Comment: 5-10 drinks per week.   Drug use: No   Sexual activity: Not on file

## 2021-12-30 ENCOUNTER — Ambulatory Visit (INDEPENDENT_AMBULATORY_CARE_PROVIDER_SITE_OTHER): Payer: BC Managed Care – PPO | Admitting: Physical Medicine and Rehabilitation

## 2021-12-30 ENCOUNTER — Ambulatory Visit: Payer: Self-pay

## 2021-12-30 DIAGNOSIS — M5416 Radiculopathy, lumbar region: Secondary | ICD-10-CM | POA: Diagnosis not present

## 2021-12-30 MED ORDER — METHYLPREDNISOLONE ACETATE 80 MG/ML IJ SUSP
80.0000 mg | Freq: Once | INTRAMUSCULAR | Status: AC
  Administered 2021-12-30: 80 mg

## 2021-12-30 NOTE — Progress Notes (Signed)
Left L5 transforaminal epidural steroid injection planned injection today

## 2021-12-30 NOTE — Patient Instructions (Signed)

## 2022-01-05 NOTE — Procedures (Signed)
Lumbosacral Transforaminal Epidural Steroid Injection - Sub-Pedicular Approach with Fluoroscopic Guidance  Patient: Kristina Larson      Date of Birth: 02-18-1970 MRN: [id number] PCP: Candice Camp, MD      Visit Date: 12/30/2021   Universal Protocol:    Date/Time: 12/30/2021  Consent Given By: the patient  Position: PRONE  Additional Comments: Vital signs were monitored before and after the procedure. Patient was prepped and draped in the usual sterile fashion. The correct patient, procedure, and site was verified.   Injection Procedure Details:   Procedure diagnoses: Lumbar radiculopathy [M54.16]    Meds Administered:  Meds ordered this encounter  Medications   methylPREDNISolone acetate (DEPO-MEDROL) injection 80 mg    Laterality: Left  Location/Site: L5  Needle:5.0 in., 22 ga.  Short bevel or Quincke spinal needle  Needle Placement: Transforaminal  Findings:    -Comments: Excellent flow of contrast along the nerve, nerve root and into the epidural space.  Procedure Details: After squaring off the end-plates to get a true AP view, the C-arm was positioned so that an oblique view of the foramen as noted above was visualized. The target area is just inferior to the "nose of the scotty dog" or sub pedicular. The soft tissues overlying this structure were infiltrated with 2-3 ml. of 1% Lidocaine without Epinephrine.  The spinal needle was inserted toward the target using a "trajectory" view along the fluoroscope beam.  Under AP and lateral visualization, the needle was advanced so it did not puncture dura and was located close the 6 O'Clock position of the pedical in AP tracterory. Biplanar projections were used to confirm position. Aspiration was confirmed to be negative for CSF and/or blood. A 1-2 ml. volume of Isovue-250 was injected and flow of contrast was noted at each level. Radiographs were obtained for documentation purposes.   After attaining the desired flow of  contrast documented above, a 0.5 to 1.0 ml test dose of 0.25% Marcaine was injected into each respective transforaminal space.  The patient was observed for 90 seconds post injection.  After no sensory deficits were reported, and normal lower extremity motor function was noted,   the above injectate was administered so that equal amounts of the injectate were placed at each foramen (level) into the transforaminal epidural space.   Additional Comments:  The patient tolerated the procedure well Dressing: 2 x 2 sterile gauze and Band-Aid    Post-procedure details: Patient was observed during the procedure. Post-procedure instructions were reviewed.  Patient left the clinic in stable condition.

## 2022-01-05 NOTE — Progress Notes (Signed)
Kristina Larson - 51 y.o. female MRN [id number]  Date of birth: 1970/02/28  Office Visit Note: Visit Date: 12/30/2021 PCP: Candice Camp, MD Referred by: Candice Camp, MD  Subjective: Chief Complaint  Patient presents with   Lower Back - Pain   HPI:  Kristina Larson is a 51 y.o. female who comes in today at the request of Ellin Goodie, FNP for planned Left L5-S1 Lumbar Transforaminal epidural steroid injection with fluoroscopic guidance.  The patient has failed conservative care including home exercise, medications, time and activity modification.  This injection will be diagnostic and hopefully therapeutic.  Please see requesting physician notes for further details and justification.   ROS Otherwise per HPI.  Assessment & Plan: Visit Diagnoses:    ICD-10-CM   1. Lumbar radiculopathy  M54.16 XR C-ARM NO REPORT    Epidural Steroid injection    methylPREDNISolone acetate (DEPO-MEDROL) injection 80 mg      Plan: No additional findings.   Meds & Orders:  Meds ordered this encounter  Medications   methylPREDNISolone acetate (DEPO-MEDROL) injection 80 mg    Orders Placed This Encounter  Procedures   XR C-ARM NO REPORT   Epidural Steroid injection    Follow-up: Return for visit to requesting provider as needed.   Procedures: No procedures performed  Lumbosacral Transforaminal Epidural Steroid Injection - Sub-Pedicular Approach with Fluoroscopic Guidance  Patient: Kristina Larson      Date of Birth: [birth year] MRN: [id number] PCP: Candice Camp, MD      Visit Date: 12/30/2021   Universal Protocol:    Date/Time: 12/30/2021  Consent Given By: the patient  Position: PRONE  Additional Comments: Vital signs were monitored before and after the procedure. Patient was prepped and draped in the usual sterile fashion. The correct patient, procedure, and site was verified.   Injection Procedure Details:   Procedure diagnoses: Lumbar radiculopathy [M54.16]    Meds  Administered:  Meds ordered this encounter  Medications   methylPREDNISolone acetate (DEPO-MEDROL) injection 80 mg    Laterality: Left  Location/Site: L5  Needle:5.0 in., 22 ga.  Short bevel or Quincke spinal needle  Needle Placement: Transforaminal  Findings:    -Comments: Excellent flow of contrast along the nerve, nerve root and into the epidural space.  Procedure Details: After squaring off the end-plates to get a true AP view, the C-arm was positioned so that an oblique view of the foramen as noted above was visualized. The target area is just inferior to the "nose of the scotty dog" or sub pedicular. The soft tissues overlying this structure were infiltrated with 2-3 ml. of 1% Lidocaine without Epinephrine.  The spinal needle was inserted toward the target using a "trajectory" view along the fluoroscope beam.  Under AP and lateral visualization, the needle was advanced so it did not puncture dura and was located close the 6 O'Clock position of the pedical in AP tracterory. Biplanar projections were used to confirm position. Aspiration was confirmed to be negative for CSF and/or blood. A 1-2 ml. volume of Isovue-250 was injected and flow of contrast was noted at each level. Radiographs were obtained for documentation purposes.   After attaining the desired flow of contrast documented above, a 0.5 to 1.0 ml test dose of 0.25% Marcaine was injected into each respective transforaminal space.  The patient was observed for 90 seconds post injection.  After no sensory deficits were reported, and normal lower extremity motor function was noted,   the above injectate was administered so  that equal amounts of the injectate were placed at each foramen (level) into the transforaminal epidural space.   Additional Comments:  The patient tolerated the procedure well Dressing: 2 x 2 sterile gauze and Band-Aid    Post-procedure details: Patient was observed during the procedure. Post-procedure  instructions were reviewed.  Patient left the clinic in stable condition.    Clinical History: EXAM: MRI LUMBAR SPINE WITHOUT CONTRAST   TECHNIQUE: Multiplanar, multisequence MR imaging of the lumbar spine was performed. No intravenous contrast was administered.   COMPARISON:  None Available.   FINDINGS: Segmentation:  Standard.   Alignment:  Physiologic.   Vertebrae: No fracture, evidence of discitis, or bone lesion. Posterior arches of the sacrum are difficult to visualize/gracile an incompletely formed by abdominal CT 08/23/2021.   Conus medullaris and cauda equina: Conus extends to the L5-S1 level. Tethering fatty mass at the level of S2/3, simple appearing by CT and sagittal MR coverage, consistent with 2.5 cm lipoma.   Paraspinal and other soft tissues: Negative for visible presacral mass.   Disc levels:   Diffusely preserved disc height and hydration. Mild facet spurring at L5-S1 and L4-5 on the right. Minor disc bulging towards the left at L5-S1.   IMPRESSION: 1. 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 2. Early degenerative changes without impingement.     Electronically Signed   By: Tiburcio Pea M.D.   On: 12/09/2021 19:08     Objective:  VS:  HT:    WT:   BMI:     BP:   HR: bpm  TEMP: ( )  RESP:  Physical Exam Vitals and nursing note reviewed.  Constitutional:      General: She is not in acute distress.    Appearance: Normal appearance. She is not ill-appearing.  HENT:     Head: Normocephalic and atraumatic.     Right Ear: External ear normal.     Left Ear: External ear normal.  Eyes:     Extraocular Movements: Extraocular movements intact.  Cardiovascular:     Rate and Rhythm: Normal rate.     Pulses: Normal pulses.  Pulmonary:     Effort: Pulmonary effort is normal. No respiratory distress.  Abdominal:     General: There is no distension.     Palpations: Abdomen is soft.  Musculoskeletal:         General: Tenderness present.     Cervical back: Neck supple.     Right lower leg: No edema.     Left lower leg: No edema.     Comments: Patient has good distal strength with no pain over the greater trochanters.  No clonus or focal weakness.  Skin:    Findings: No erythema, lesion or rash.  Neurological:     General: No focal deficit present.     Mental Status: She is alert and oriented to person, place, and time.     Sensory: No sensory deficit.     Motor: No weakness or abnormal muscle tone.     Coordination: Coordination normal.  Psychiatric:        Mood and Affect: Mood normal.        Behavior: Behavior normal.      Imaging: No results found.

## 2022-02-17 ENCOUNTER — Telehealth: Payer: Self-pay | Admitting: Physical Medicine and Rehabilitation

## 2022-02-17 NOTE — Telephone Encounter (Signed)
Pt called to set an appt. Please cal pt at [phone]

## 2022-02-23 ENCOUNTER — Telehealth: Payer: Self-pay | Admitting: Physical Medicine and Rehabilitation

## 2022-02-23 NOTE — Telephone Encounter (Signed)
Patient states she was supposed to get a follow up appt for another injection. Please call [phone]

## 2022-02-24 ENCOUNTER — Other Ambulatory Visit: Payer: Self-pay | Admitting: Physical Medicine and Rehabilitation

## 2022-02-24 DIAGNOSIS — M5416 Radiculopathy, lumbar region: Secondary | ICD-10-CM

## 2022-02-24 NOTE — Telephone Encounter (Signed)
Spoke with patient and she is requesting another injection. The last injection lasted about 2 months and it helped 75-80%. The pain is the same and no new falls, injuries or accidents

## 2022-03-16 ENCOUNTER — Other Ambulatory Visit: Payer: Self-pay | Admitting: Physical Medicine and Rehabilitation

## 2022-03-16 ENCOUNTER — Encounter: Payer: BC Managed Care – PPO | Admitting: Physical Medicine and Rehabilitation

## 2022-03-16 ENCOUNTER — Telehealth: Payer: Self-pay

## 2022-03-16 MED ORDER — GABAPENTIN 100 MG PO CAPS: 100.0000 mg | ORAL_CAPSULE | Freq: Every day | ORAL | 1 refills | Status: DC

## 2022-03-16 MED ORDER — CELECOXIB 200 MG PO CAPS: 200.0000 mg | ORAL_CAPSULE | Freq: Every day | ORAL | 0 refills | Status: DC

## 2022-03-16 NOTE — Telephone Encounter (Signed)
Spoke with patient and she stated [employer] will cover the injection every 3 months. She wants to know if Celebrex and Gabapentin can be sent to [facility]. She is rescheduled for 04/06/22

## 2022-04-06 ENCOUNTER — Ambulatory Visit (INDEPENDENT_AMBULATORY_CARE_PROVIDER_SITE_OTHER): Payer: BC Managed Care – PPO | Admitting: Physical Medicine and Rehabilitation

## 2022-04-06 ENCOUNTER — Ambulatory Visit: Payer: Self-pay

## 2022-04-06 VITALS — BP 122/86 | HR 68

## 2022-04-06 DIAGNOSIS — M5416 Radiculopathy, lumbar region: Secondary | ICD-10-CM | POA: Diagnosis not present

## 2022-04-06 MED ORDER — METHYLPREDNISOLONE ACETATE 80 MG/ML IJ SUSP
80.0000 mg | Freq: Once | INTRAMUSCULAR | Status: AC
  Administered 2022-04-06: 80 mg

## 2022-04-06 MED ORDER — GABAPENTIN 100 MG PO CAPS: 100.0000 mg | ORAL_CAPSULE | Freq: Every day | ORAL | 1 refills | Status: DC

## 2022-04-06 NOTE — Patient Instructions (Signed)

## 2022-04-06 NOTE — Progress Notes (Signed)
Functional Pain Scale - descriptive words and definitions  Unmanageable (7)  Pain interferes with normal ADL's/nothing seems to help/sleep is very difficult/active distractions are very difficult to concentrate on. Severe range order  Average Pain 7-8   +Driver, -BT, -Dye Allergies.  Lower back pain that radiates into left leg to the foot

## 2022-04-12 ENCOUNTER — Other Ambulatory Visit: Payer: Self-pay | Admitting: Physical Medicine and Rehabilitation

## 2022-04-13 NOTE — Procedures (Signed)
Lumbosacral Transforaminal Epidural Steroid Injection - Sub-Pedicular Approach with Fluoroscopic Guidance  Patient: Kristina Larson      Date of Birth: 07-10-1970 MRN: [id number] PCP: Louretta Shorten, MD      Visit Date: 04/06/2022   Universal Protocol:    Date/Time: 04/06/2022  Consent Given By: the patient  Position: PRONE  Additional Comments: Vital signs were monitored before and after the procedure. Patient was prepped and draped in the usual sterile fashion. The correct patient, procedure, and site was verified.   Injection Procedure Details:   Procedure diagnoses: Lumbar radiculopathy [M54.16]    Meds Administered:  Meds ordered this encounter  Medications   methylPREDNISolone acetate (DEPO-MEDROL) injection 80 mg   gabapentin (NEURONTIN) 100 MG capsule    Sig: Take 1 capsule (100 mg total) by mouth at bedtime.    Dispense:  60 capsule    Refill:  1    Laterality: Left  Location/Site: L5  Needle:5.0 in., 22 ga.  Short bevel or Quincke spinal needle  Needle Placement: Transforaminal  Findings:    -Comments: Excellent flow of contrast along the nerve, nerve root and into the epidural space.  Procedure Details: After squaring off the end-plates to get a true AP view, the C-arm was positioned so that an oblique view of the foramen as noted above was visualized. The target area is just inferior to the "nose of the scotty dog" or sub pedicular. The soft tissues overlying this structure were infiltrated with 2-3 ml. of 1% Lidocaine without Epinephrine.  The spinal needle was inserted toward the target using a "trajectory" view along the fluoroscope beam.  Under AP and lateral visualization, the needle was advanced so it did not puncture dura and was located close the 6 O'Clock position of the pedical in AP tracterory. Biplanar projections were used to confirm position. Aspiration was confirmed to be negative for CSF and/or blood. A 1-2 ml. volume of Isovue-250 was injected  and flow of contrast was noted at each level. Radiographs were obtained for documentation purposes.   After attaining the desired flow of contrast documented above, a 0.5 to 1.0 ml test dose of 0.25% Marcaine was injected into each respective transforaminal space.  The patient was observed for 90 seconds post injection.  After no sensory deficits were reported, and normal lower extremity motor function was noted,   the above injectate was administered so that equal amounts of the injectate were placed at each foramen (level) into the transforaminal epidural space.   Additional Comments:  No complications occurred Dressing: 2 x 2 sterile gauze and Band-Aid    Post-procedure details: Patient was observed during the procedure. Post-procedure instructions were reviewed.  Patient left the clinic in stable condition.

## 2022-04-13 NOTE — Progress Notes (Signed)
Kristina Larson - 52 y.o. female MRN [id number]  Date of birth: August 07, 1970  Office Visit Note: Visit Date: 04/06/2022 PCP: Louretta Shorten, MD Referred by: Louretta Shorten, MD  Subjective: Chief Complaint  Patient presents with   Lower Back - Pain   HPI:  Kristina Larson is a 52 y.o. female who comes in today for planned repeat Left L5-S1  Lumbar Transforaminal epidural steroid injection with fluoroscopic guidance.  The patient has failed conservative care including home exercise, medications, time and activity modification.  This injection will be diagnostic and hopefully therapeutic.  Please see requesting physician notes for further details and justification. Patient received more than 50% pain relief from prior injection.   Referring: Barnet Pall, FNP   ROS Otherwise per HPI.  Assessment & Plan: Visit Diagnoses:    ICD-10-CM   1. Lumbar radiculopathy  M54.16 XR C-ARM NO REPORT    Epidural Steroid injection    methylPREDNISolone acetate (DEPO-MEDROL) injection 80 mg      Plan: No additional findings.   Meds & Orders:  Meds ordered this encounter  Medications   methylPREDNISolone acetate (DEPO-MEDROL) injection 80 mg   gabapentin (NEURONTIN) 100 MG capsule    Sig: Take 1 capsule (100 mg total) by mouth at bedtime.    Dispense:  60 capsule    Refill:  1    Orders Placed This Encounter  Procedures   XR C-ARM NO REPORT   Epidural Steroid injection    Follow-up: Return for visit to requesting provider as needed.   Procedures: No procedures performed  Lumbosacral Transforaminal Epidural Steroid Injection - Sub-Pedicular Approach with Fluoroscopic Guidance  Patient: Kristina Larson      Date of Birth: 1970-12-12 MRN: [id number] PCP: Louretta Shorten, MD      Visit Date: 04/06/2022   Universal Protocol:    Date/Time: 04/06/2022  Consent Given By: the patient  Position: PRONE  Additional Comments: Vital signs were monitored before and after the procedure. Patient was  prepped and draped in the usual sterile fashion. The correct patient, procedure, and site was verified.   Injection Procedure Details:   Procedure diagnoses: Lumbar radiculopathy [M54.16]    Meds Administered:  Meds ordered this encounter  Medications   methylPREDNISolone acetate (DEPO-MEDROL) injection 80 mg   gabapentin (NEURONTIN) 100 MG capsule    Sig: Take 1 capsule (100 mg total) by mouth at bedtime.    Dispense:  60 capsule    Refill:  1    Laterality: Left  Location/Site: L5  Needle:5.0 in., 22 ga.  Short bevel or Quincke spinal needle  Needle Placement: Transforaminal  Findings:    -Comments: Excellent flow of contrast along the nerve, nerve root and into the epidural space.  Procedure Details: After squaring off the end-plates to get a true AP view, the C-arm was positioned so that an oblique view of the foramen as noted above was visualized. The target area is just inferior to the "nose of the scotty dog" or sub pedicular. The soft tissues overlying this structure were infiltrated with 2-3 ml. of 1% Lidocaine without Epinephrine.  The spinal needle was inserted toward the target using a "trajectory" view along the fluoroscope beam.  Under AP and lateral visualization, the needle was advanced so it did not puncture dura and was located close the 6 O'Clock position of the pedical in AP tracterory. Biplanar projections were used to confirm position. Aspiration was confirmed to be negative for CSF and/or blood. A 1-2 ml. volume of  Isovue-250 was injected and flow of contrast was noted at each level. Radiographs were obtained for documentation purposes.   After attaining the desired flow of contrast documented above, a 0.5 to 1.0 ml test dose of 0.25% Marcaine was injected into each respective transforaminal space.  The patient was observed for 90 seconds post injection.  After no sensory deficits were reported, and normal lower extremity motor function was noted,   the above  injectate was administered so that equal amounts of the injectate were placed at each foramen (level) into the transforaminal epidural space.   Additional Comments:  No complications occurred Dressing: 2 x 2 sterile gauze and Band-Aid    Post-procedure details: Patient was observed during the procedure. Post-procedure instructions were reviewed.  Patient left the clinic in stable condition.    Clinical History: EXAM: MRI LUMBAR SPINE WITHOUT CONTRAST   TECHNIQUE: Multiplanar, multisequence MR imaging of the lumbar spine was performed. No intravenous contrast was administered.   COMPARISON:  None Available.   FINDINGS: Segmentation:  Standard.   Alignment:  Physiologic.   Vertebrae: No fracture, evidence of discitis, or bone lesion. Posterior arches of the sacrum are difficult to visualize/gracile an incompletely formed by abdominal CT 08/23/2021.   Conus medullaris and cauda equina: Conus extends to the L5-S1 level. Tethering fatty mass at the level of S2/3, simple appearing by CT and sagittal MR coverage, consistent with 2.5 cm lipoma.   Paraspinal and other soft tissues: Negative for visible presacral mass.   Disc levels:   Diffusely preserved disc height and hydration. Mild facet spurring at L5-S1 and L4-5 on the right. Minor disc bulging towards the left at L5-S1.   IMPRESSION: 1. 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 2. Early degenerative changes without impingement.     Electronically Signed   By: Jorje Guild M.D.   On: 12/09/2021 19:08     Objective:  VS:  HT:    WT:   BMI:     BP:122/86  HR:68bpm  TEMP: ( )  RESP:  Physical Exam Vitals and nursing note reviewed.  Constitutional:      General: She is not in acute distress.    Appearance: Normal appearance. She is not ill-appearing.  HENT:     Head: Normocephalic and atraumatic.     Right Ear: External ear normal.     Left Ear: External ear normal.  Eyes:      Extraocular Movements: Extraocular movements intact.  Cardiovascular:     Rate and Rhythm: Normal rate.     Pulses: Normal pulses.  Pulmonary:     Effort: Pulmonary effort is normal. No respiratory distress.  Abdominal:     General: There is no distension.     Palpations: Abdomen is soft.  Musculoskeletal:        General: Tenderness present.     Cervical back: Neck supple.     Right lower leg: No edema.     Left lower leg: No edema.     Comments: Patient has good distal strength with no pain over the greater trochanters.  No clonus or focal weakness.  Skin:    Findings: No erythema, lesion or rash.  Neurological:     General: No focal deficit present.     Mental Status: She is alert and oriented to person, place, and time.     Sensory: No sensory deficit.     Motor: No weakness or abnormal muscle tone.     Coordination: Coordination normal.  Psychiatric:        Mood and Affect: Mood normal.        Behavior: Behavior normal.      Imaging: No results found.

## 2022-05-10 ENCOUNTER — Telehealth: Payer: Self-pay

## 2022-05-10 NOTE — Telephone Encounter (Signed)
Patient left a voicemail stating she is out of the gabapentin and she stated Dr. Ernestina Patches was going to slowly bump her up. She is also requesting celebrex. Please advise

## 2022-05-12 ENCOUNTER — Other Ambulatory Visit: Payer: Self-pay | Admitting: Physical Medicine and Rehabilitation

## 2022-05-12 MED ORDER — CELECOXIB 200 MG PO CAPS: ORAL_CAPSULE | ORAL | 0 refills | Status: DC

## 2022-05-12 MED ORDER — GABAPENTIN 100 MG PO CAPS: 100.0000 mg | ORAL_CAPSULE | Freq: Every day | ORAL | 1 refills | Status: DC

## 2022-05-12 NOTE — Progress Notes (Signed)
Spoke with patient this morning, instructed her to go up to 200 mg Gabapentin at bedtime for 1 week, then 300 mg for 1 week. If tolerating well I will prescribe 300 mg tablets for her. I also refilled Celebrex.

## 2022-05-12 NOTE — Telephone Encounter (Signed)
Patient aware Rx's were sent in for her

## 2022-05-25 ENCOUNTER — Telehealth: Payer: Self-pay

## 2022-05-25 ENCOUNTER — Telehealth: Payer: Self-pay | Admitting: Physical Medicine and Rehabilitation

## 2022-05-25 MED ORDER — GABAPENTIN 300 MG PO CAPS: 300.0000 mg | ORAL_CAPSULE | Freq: Every day | ORAL | 1 refills | Status: DC

## 2022-05-25 NOTE — Telephone Encounter (Signed)
Spoke with patient this afternoon, she reports issues with vaginal bleeding and weight gain post injection. She is up to 300 mg of Gabapentin, also taking Celebrex twice a day. I refilled Gabapentin today.

## 2022-05-25 NOTE — Telephone Encounter (Signed)
Patient called in stating she was informed to increase the Gabapentin and could possibly start the 300 mg. The pharmacy will not let her refill it because it was not rewritten. She needs a new prescription sent in for the Gabapentin and she stated it may be increased to 300 mg at night. She also needs a refill on the celebrex. Please advise

## 2022-05-25 NOTE — Telephone Encounter (Signed)
Patient is scheduled to follow up with OBGYN in the coming weeks to discuss vaginal bleeding post injection. Vaginal bleeding can be side effect of glucocorticoid medications.

## 2022-06-18 ENCOUNTER — Other Ambulatory Visit: Payer: Self-pay | Admitting: Physical Medicine and Rehabilitation

## 2022-07-20 ENCOUNTER — Other Ambulatory Visit: Payer: Self-pay | Admitting: Physical Medicine and Rehabilitation

## 2022-08-12 ENCOUNTER — Telehealth: Payer: Self-pay

## 2022-08-12 MED ORDER — GABAPENTIN 300 MG PO CAPS: 300.0000 mg | ORAL_CAPSULE | Freq: Every day | ORAL | 0 refills | Status: DC

## 2022-08-12 MED ORDER — GABAPENTIN 100 MG PO CAPS: 100.0000 mg | ORAL_CAPSULE | Freq: Two times a day (BID) | ORAL | 1 refills | Status: DC

## 2022-08-12 MED ORDER — GABAPENTIN 600 MG PO TABS: 600.0000 mg | ORAL_TABLET | Freq: Three times a day (TID) | ORAL | 1 refills | Status: DC

## 2022-08-12 NOTE — Telephone Encounter (Signed)
Increased gababpentin needs f/up with Aundra Millet

## 2022-08-12 NOTE — Addendum Note (Signed)
Addended by: Ashok Norris on: 08/12/2022 12:13 PM   Modules accepted: Orders

## 2022-08-12 NOTE — Telephone Encounter (Signed)
Patient would like to have the same amount of the 300 mg Gabapentin with an addition of 100 mg during the day

## 2022-08-12 NOTE — Telephone Encounter (Signed)
Patient called in and requested the Gabapentin be increased a little for her pain. Please advise

## 2022-08-18 ENCOUNTER — Other Ambulatory Visit: Payer: Self-pay | Admitting: Physical Medicine and Rehabilitation

## 2022-09-12 ENCOUNTER — Telehealth: Payer: Self-pay | Admitting: Physical Medicine and Rehabilitation

## 2022-09-12 ENCOUNTER — Ambulatory Visit: Payer: BC Managed Care – PPO | Admitting: Physical Medicine and Rehabilitation

## 2022-09-12 NOTE — Telephone Encounter (Signed)
Spoke with patient and she spoke with her GYN who informed her that the [facility] may have thrown her hormones off. She was scheduled for an appointment and cancelled. She wants to know if there are any long term solutions outside of injections. Please advise

## 2022-09-12 NOTE — Telephone Encounter (Signed)
Pt would like to reschedule appt and ask a few questions please advise

## 2022-09-15 NOTE — Telephone Encounter (Signed)
LVM to inform patient to return call to give her information below

## 2022-09-22 ENCOUNTER — Other Ambulatory Visit: Payer: Self-pay | Admitting: Physical Medicine and Rehabilitation

## 2022-09-29 IMAGING — MG MM DIGITAL SCREENING BILAT W/ TOMO AND CAD
8 series · 8 of 24 positions shown · non-contrast
Comparison: Previous exam(s).

CLINICAL DATA: Screening.

EXAM:
DIGITAL SCREENING BILATERAL MAMMOGRAM WITH TOMOSYNTHESIS AND CAD
TECHNIQUE: Bilateral screening digital craniocaudal and mediolateral oblique
mammograms were obtained. Bilateral screening digital breast
tomosynthesis was performed. The images were evaluated with
computer-aided detection.

[L MLO synth-2D]
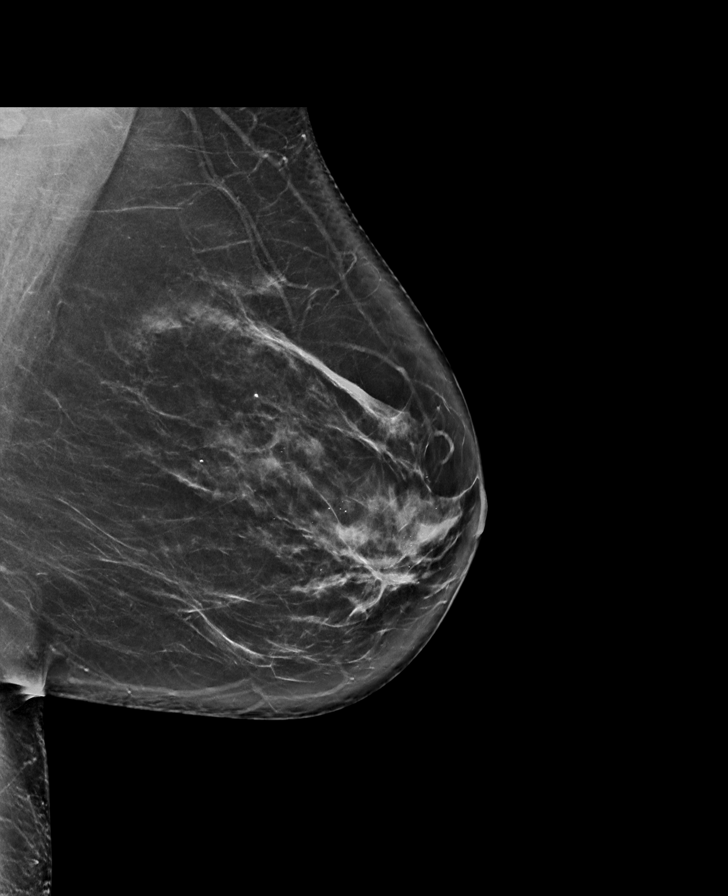

[L CC synth-2D]
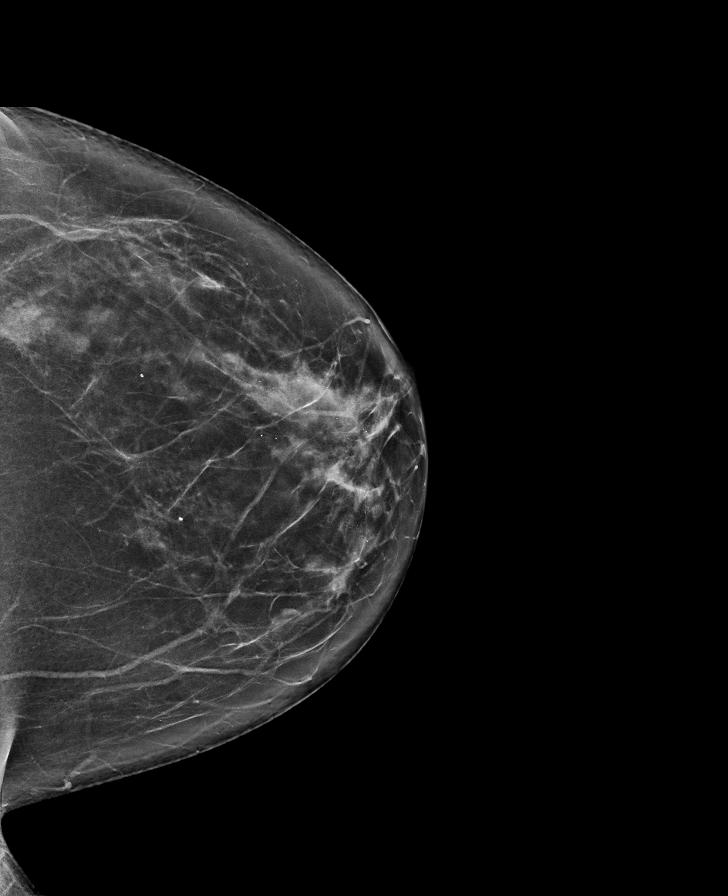

[R CC synth-2D]
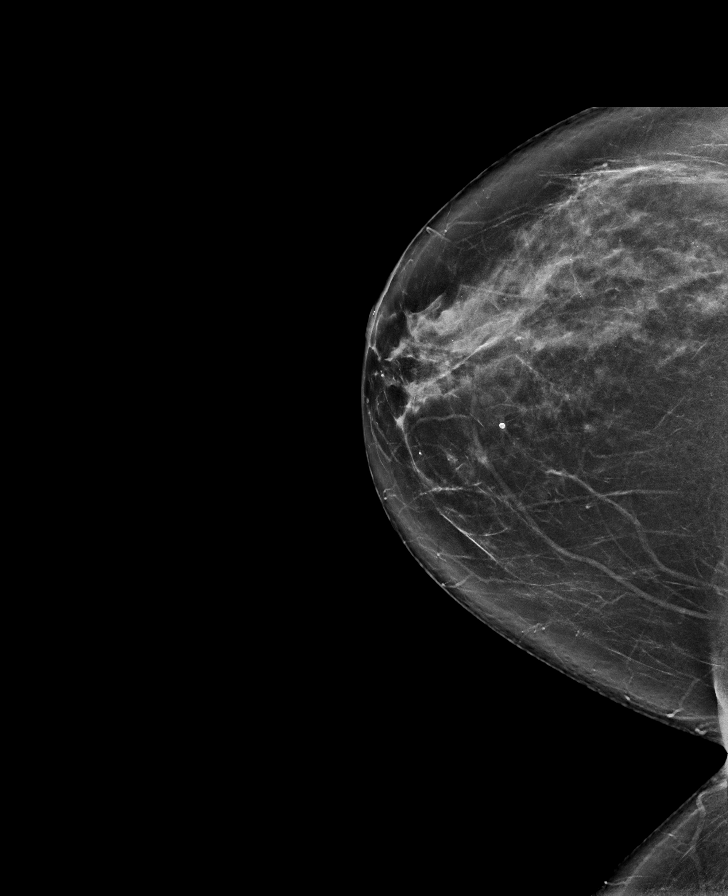

[R MLO synth-2D]
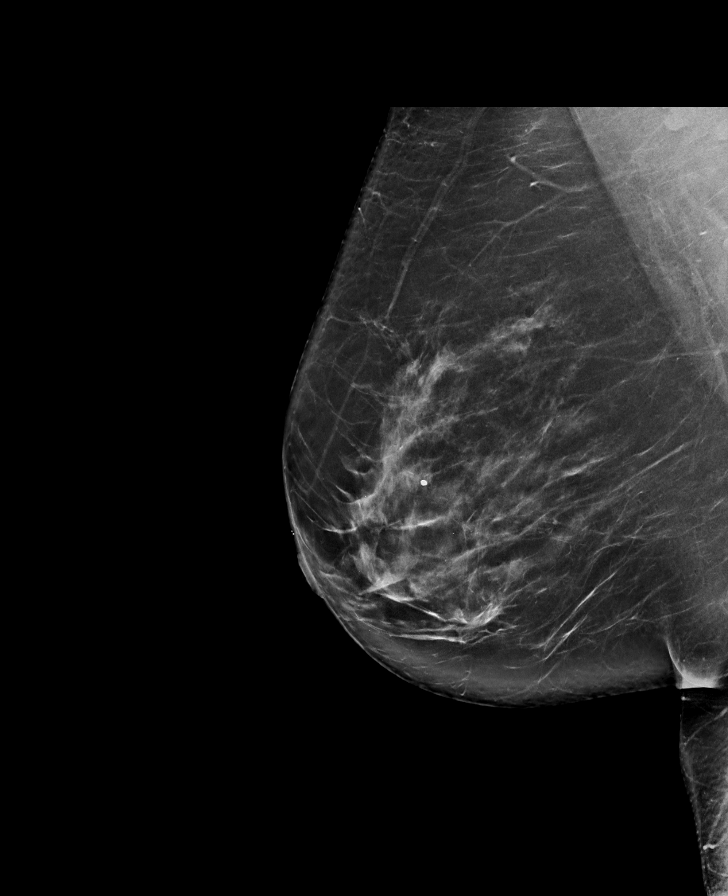

[R CC tomo · tomo slice 41/80.0]
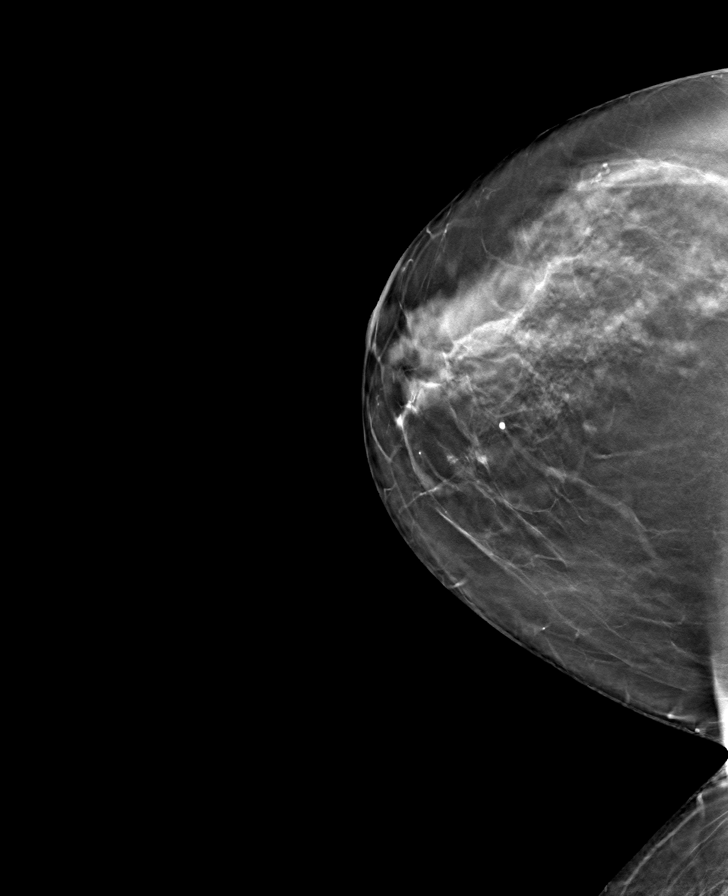

[R MLO tomo · tomo slice 47/92.0]
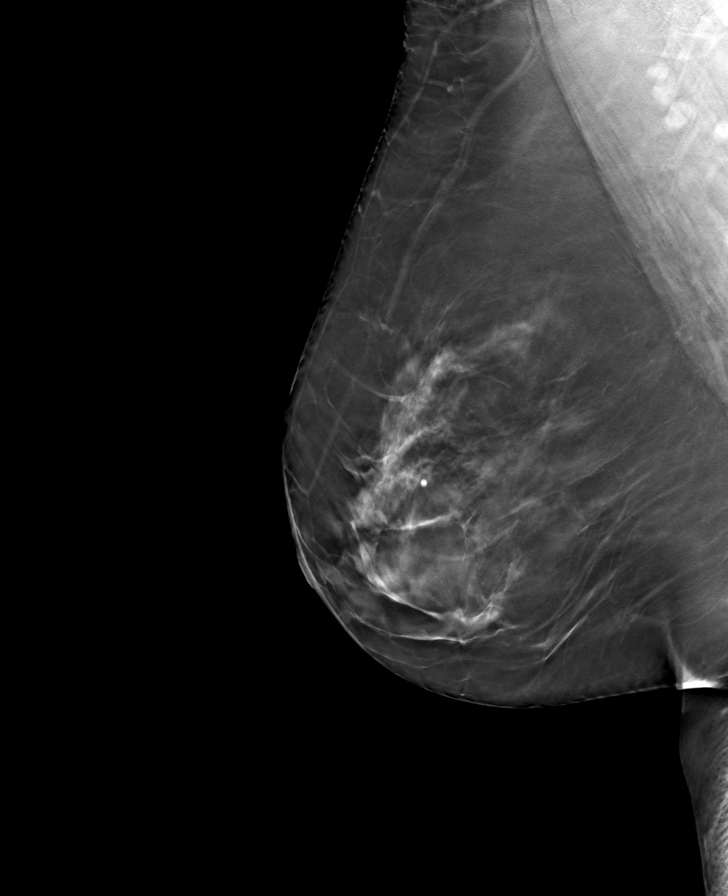

[L MLO tomo · tomo slice 45/89.0]
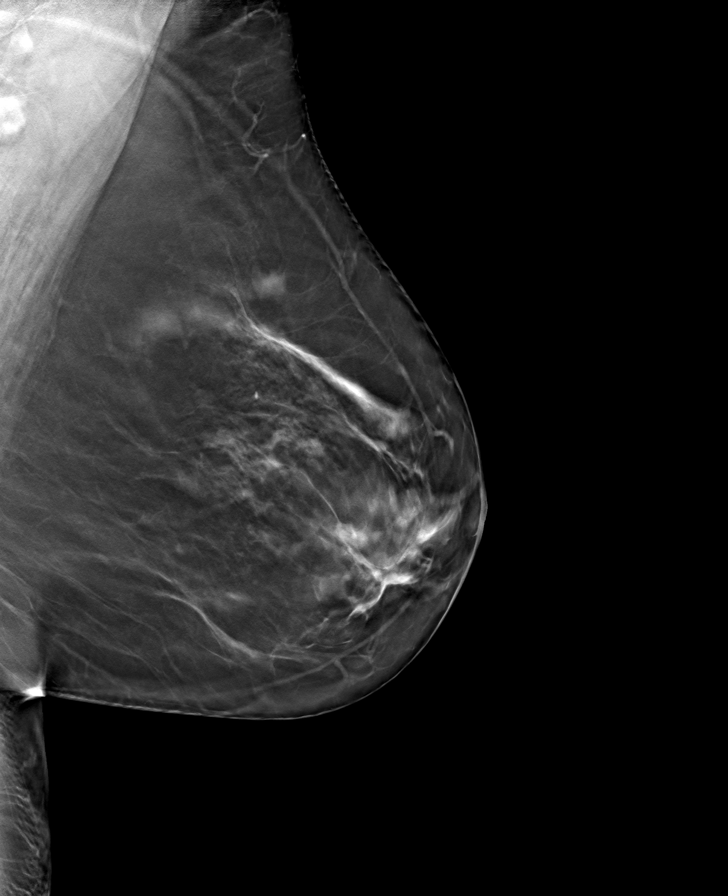

[L CC tomo · tomo slice 41/81.0]
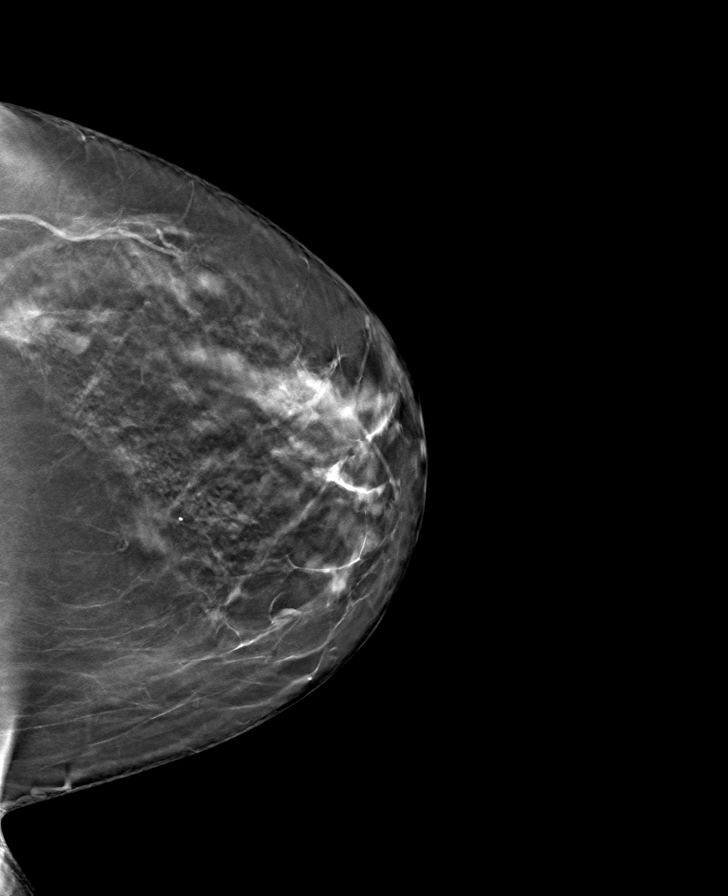

[8 of 24 positions shown; findings below may reference images not displayed]

ACR Breast Density Category c: The breast tissue is heterogeneously
dense, which may obscure small masses.
FINDINGS: There are no findings suspicious for malignancy.
IMPRESSION: No mammographic evidence of malignancy. A result letter of this
screening mammogram will be mailed directly to the patient.

RECOMMENDATION:
Screening mammogram in one year. (Code:[id number]-W-BC3)

BI-RADS CATEGORY  1: Negative.

## 2022-10-27 ENCOUNTER — Other Ambulatory Visit: Payer: Self-pay | Admitting: Physical Medicine and Rehabilitation

## 2022-11-18 ENCOUNTER — Other Ambulatory Visit: Payer: Self-pay | Admitting: Physical Medicine and Rehabilitation

## 2022-11-30 ENCOUNTER — Other Ambulatory Visit: Payer: Self-pay | Admitting: Physical Medicine and Rehabilitation

## 2022-12-01 ENCOUNTER — Other Ambulatory Visit: Payer: Self-pay | Admitting: Physical Medicine and Rehabilitation

## 2022-12-22 ENCOUNTER — Encounter: Payer: Self-pay | Admitting: Physical Medicine and Rehabilitation

## 2022-12-22 ENCOUNTER — Ambulatory Visit: Payer: BC Managed Care – PPO | Admitting: Physical Medicine and Rehabilitation

## 2022-12-22 DIAGNOSIS — M5416 Radiculopathy, lumbar region: Secondary | ICD-10-CM | POA: Diagnosis not present

## 2022-12-22 DIAGNOSIS — Q068 Other specified congenital malformations of spinal cord: Secondary | ICD-10-CM

## 2022-12-22 DIAGNOSIS — G8929 Other chronic pain: Secondary | ICD-10-CM

## 2022-12-22 DIAGNOSIS — M5442 Lumbago with sciatica, left side: Secondary | ICD-10-CM | POA: Diagnosis not present

## 2022-12-22 MED ORDER — GABAPENTIN 300 MG PO CAPS: 300.0000 mg | ORAL_CAPSULE | Freq: Two times a day (BID) | ORAL | 0 refills | Status: DC

## 2022-12-22 MED ORDER — CELECOXIB 200 MG PO CAPS: 200.0000 mg | ORAL_CAPSULE | Freq: Every day | ORAL | 0 refills | Status: DC

## 2022-12-22 NOTE — Progress Notes (Signed)
Lower back pain L side. She is on her feet all day and she is still having the foot pain L>R  She  feels like it is tendonitis. She is stating that it is worse in her L foot / leg  She just would like to know what the next steps are

## 2022-12-22 NOTE — Patient Instructions (Signed)

## 2022-12-22 NOTE — Progress Notes (Signed)
Kristina Larson - 52 y.o. female MRN [id number]  Date of birth: September 15, 1970  Office Visit Note: Visit Date: 12/22/2022 PCP: Candice Camp, MD Referred by: Candice Camp, MD  Subjective: Chief Complaint  Patient presents with   Lower Back - Pain   HPI: Kristina Larson is a 52 y.o. female who comes in today for evaluation of chronic, worsening and severe left sided lower back pain radiating to buttock and down lateral leg to foot. Pain ongoing for 20 plus years, worsens with movement and activity. She describes pain as sore and aching, currently rates as 8 out of 10. Some relief of pain with home exercise regimen, heating pad, rest and use of medications. Currently taking Gabapentin/Celebrex with good relief of pain. No history of formal physical therapy. 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 We also noted slight left lateral disc protrusion at the level of L5 that is not dictated in report. No high grade spinal canal stenosis. She underwent (2) left L5 transforaminal epidural injections in our office, most recent was done on 04/06/2022. She reports some relief of pain for about 2 months with this procedure. She reports complicated with vaginal bleeding and weight gain after injections. Patient is here today to discuss treatment options and medication management. She denies focal weakness, numbness and tingling. No recent trauma or falls.        Review of Systems  Musculoskeletal:  Positive for back pain.  Neurological:  Negative for tingling, sensory change, focal weakness and weakness.  All other systems reviewed and are negative.  Otherwise per HPI.  Assessment & Plan: Visit Diagnoses:    ICD-10-CM   1. Chronic left-sided low back pain with left-sided sciatica  M54.42    G89.29     2. Lumbar radiculopathy  M54.16     3. Tethered cord syndrome (HCC)  Q06.8        Plan:  Findings:  Chronic, worsening and severe left sided lower back pain radiating to buttock and down lateral leg to foot. Patient continues to have severe pain despite good conservative therapies such as home exercise regimen, rest and use of medications. Patients clinical presentation and exam are consistent with L5 nerve pattern. Minimal relief of pain with most recent left L5 transforaminal injection in February. She did experience vaginal bleeding and weight gain post injection, she is being closely followed by OBGYN. We discussed treatment plan in detail today. At this point, she would like to hold on interventional spine procedures as injections did not seem to provide lasting relief of pain and could be related to vaginal bleeding. I discussed treatment options such as formal physical therapy and/or referral to surgeon. I also increased Gabapentin to 600 mg at bedtime. I would like to keep her on 200 mg Celebrex daily, she did inquire about increasing to 400 mg a day, however I recommend speaking to her OBGYN regarding this with her history of vaginal bleeding. I do feel patient would benefit from surgical consult with my colleague Dr. Willia Craze. I helped her arrange this appointment today. I explained to her that Dr. Christell Constant would be able to address her surgical options and answer any questions that she may have. I am happy to continue medication management with her. No red flag symptoms noted upon exam today.     Meds & Orders:  Meds ordered this encounter  Medications   celecoxib (CELEBREX) 200 MG  capsule    Sig: Take 1 capsule (200 mg total) by mouth daily.    Dispense:  30 capsule    Refill:  0   gabapentin (NEURONTIN) 300 MG capsule    Sig: Take 1 capsule (300 mg total) by mouth 2 (two) times daily.    Dispense:  180 capsule    Refill:  0   No orders of the defined types were placed in this encounter.   Follow-up: Return for Dr. Christell Constant for evaluation.   Procedures: No procedures  performed      Clinical History: EXAM: MRI LUMBAR SPINE WITHOUT CONTRAST   TECHNIQUE: Multiplanar, multisequence MR imaging of the lumbar spine was performed. No intravenous contrast was administered.   COMPARISON:  None Available.   FINDINGS: Segmentation:  Standard.   Alignment:  Physiologic.   Vertebrae: No fracture, evidence of discitis, or bone lesion. Posterior arches of the sacrum are difficult to visualize/gracile an incompletely formed by abdominal CT 08/23/2021.   Conus medullaris and cauda equina: Conus extends to the L5-S1 level. Tethering fatty mass at the level of S2/3, simple appearing by CT and sagittal MR coverage, consistent with 2.5 cm lipoma.   Paraspinal and other soft tissues: Negative for visible presacral mass.   Disc levels:   Diffusely preserved disc height and hydration. Mild facet spurring at L5-S1 and L4-5 on the right. Minor disc bulging towards the left at L5-S1.   IMPRESSION: 1. 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 2. Early degenerative changes without impingement.     Electronically Signed   By: Tiburcio Pea M.D.   On: 12/09/2021 19:08   She reports that she has been smoking cigarettes. She has never used smokeless tobacco. No results for input(s): "HGBA1C", "LABURIC" in the last 8760 hours.  Objective:  VS:  HT:    WT:   BMI:     BP:   HR: bpm  TEMP: ( )  RESP:  Physical Exam Vitals and nursing note reviewed.  HENT:     Head: Normocephalic and atraumatic.     Right Ear: External ear normal.     Left Ear: External ear normal.     Nose: Nose normal.     Mouth/Throat:     Mouth: Mucous membranes are moist.  Eyes:     Extraocular Movements: Extraocular movements intact.  Cardiovascular:     Rate and Rhythm: Normal rate.     Pulses: Normal pulses.  Pulmonary:     Effort: Pulmonary effort is normal.  Abdominal:     General: Abdomen is flat. There is no distension.  Musculoskeletal:         General: Tenderness present.     Cervical back: Normal range of motion.     Comments: Patient rises from seated position to standing without difficulty. Good lumbar range of motion. No pain noted with facet loading. 5/5 strength noted with bilateral hip flexion, knee flexion/extension, ankle dorsiflexion/plantarflexion and EHL. No clonus noted bilaterally. No pain upon palpation of greater trochanters. No pain with internal/external rotation of bilateral hips. Sensation intact bilaterally. Dysesthesias noted to left L5 dermatome. Negative slump test bilaterally. Ambulates without aid, gait steady.     Skin:    General: Skin is warm and dry.     Capillary Refill: Capillary refill takes less than 2 seconds.  Neurological:     General: No focal deficit present.     Mental Status: She is alert and oriented to person, place, and time.  Psychiatric:  Mood and Affect: Mood normal.        Behavior: Behavior normal.     Ortho Exam  Imaging: No results found.  Past Medical/Family/Surgical/Social History: Medications & Allergies reviewed per EMR, new medications updated. Patient Active Problem List   Diagnosis Date Noted   Non-recurrent bilateral inguinal hernia without obstruction or gangrene 03/16/2021   Hypothyroidism 03/19/2020   Nonintractable episodic headache 04/25/2018   Past Medical History:  Diagnosis Date   Allergy    Anxiety    Chondromalacia of right knee    Dyslipidemia    Fibrocystic breast    Thyroid disease    HYPO   Family History  Problem Relation Age of Onset   Breast cancer Maternal Aunt        59s   Past Surgical History:  Procedure Laterality Date   ESSURE TUBAL LIGATION     TONSILLECTOMY AND ADENOIDECTOMY  age 23   Social History   Occupational History   Not on file  Tobacco Use   Smoking status: Some Days    Types: Cigarettes   Smokeless tobacco: Never  Substance and Sexual Activity   Alcohol use: Yes    Comment: 5-10 drinks per week.    Drug use: No   Sexual activity: Not on file

## 2023-01-19 ENCOUNTER — Other Ambulatory Visit (INDEPENDENT_AMBULATORY_CARE_PROVIDER_SITE_OTHER): Payer: Self-pay

## 2023-01-19 ENCOUNTER — Ambulatory Visit: Payer: BC Managed Care – PPO | Admitting: Orthopedic Surgery

## 2023-01-19 ENCOUNTER — Encounter: Payer: Self-pay | Admitting: Orthopedic Surgery

## 2023-01-19 VITALS — BP 130/85 | HR 77 | Ht 65.0 in | Wt 171.4 lb

## 2023-01-19 DIAGNOSIS — G8929 Other chronic pain: Secondary | ICD-10-CM

## 2023-01-19 DIAGNOSIS — M5442 Lumbago with sciatica, left side: Secondary | ICD-10-CM | POA: Diagnosis not present

## 2023-01-19 NOTE — Progress Notes (Signed)
Orthopedic Spine Surgery Office Note  Assessment: Patient is a 53 y.o. female with low back pain that radiates into the left buttock, left posterior thigh and lateral thigh.  No stenosis seen on MRI but does have a tethered cord with a low hanging conus   Plan: -Explained that initially conservative treatment is tried as a significant number of patients may experience relief with these treatment modalities. Discussed that the conservative treatments include:  -activity modification  -physical therapy  -over the counter pain medications  -medrol dosepak  -lumbar steroid injections -Patient has tried Celebrex, PT, lumbar steroid injections, gabapentin -Recommended continue with the Celebrex and gabapentin -I told her I do not see any significant stenosis but did go over the fact that she has a low hanging conus possibly causing tethered cord syndrome.  I explained that this is not a surgery addition that I treat.  I told her to see if one of the academic centers in the area would see her for this problem -Patient should return to office on an as-needed basis   Patient expressed understanding of the plan and all questions were answered to the patient's satisfaction.   ___________________________________________________________________________   History:  Patient is a 52 y.o. female who presents today for lumbar spine.  Patient has had over 20 years of low back pain that radiates into the left lower extremity.  She feels it along the posterior aspect of the thigh and lateralized leg.  She also has pain in the left buttock.  It is worse if she bends over or if she is standing for a long time.  It does get better if she lays down but does not completely go away.  He said it also has less bothersome if she is distracted and busy doing something.  She has some dorsal foot pain bilaterally but it does not connect in her thigh.  There is no trauma or injury that preceded the onset of her pain but she  said she noticed it during her pregnancy with her second child.  It has gotten progressively worse with time.   Weakness: Denies Symptoms of imbalance: Denies Paresthesias and numbness: Denies Bowel or bladder incontinence: Denies Saddle anesthesia: Denies  Treatments tried: Celebrex, PT, lumbar steroid injections, gabapentin  Review of systems: Denies fevers and chills, night sweats, unexplained weight loss, history of cancer, pain that wakes them at night  Past medical history: Anxiety HLD Hypothyroidism  Allergies: NKDA  Past surgical history:  Tonsillectomy Tubal ligation Hernia repair Abdominoplasty  Social history: Denies use of nicotine product (smoking, vaping, patches, smokeless) Alcohol use: Yes, approximately 6 drinks per week Denies recreational drug use   Physical Exam:  BMI of 28.5  General: no acute distress, appears stated age Neurologic: alert, answering questions appropriately, following commands Respiratory: unlabored breathing on room air, symmetric chest rise Psychiatric: appropriate affect, normal cadence to speech   MSK (spine):  -Strength exam      Left  Right EHL    5/5  5/5 TA    5/5  5/5 GSC    5/5  5/5 Knee extension  5/5  5/5 Hip flexion   5/5  5/5  -Sensory exam    Sensation intact to light touch in L3-S1 nerve distributions of bilateral lower extremities  -Achilles DTR: 1/4 on the left, 1/4 on the right -Patellar tendon DTR: 1/4 on the left, 1/4 on the right  -Straight leg raise: negative bilaterally -Femoral nerve stretch test: negative bilaterally -Clonus: no beats bilaterally  -  Left hip exam: No pain through range of motion, negative Stinchfield, negative FABER -Right hip exam: No pain through range of motion, negative Stinchfield, negative FABER  Imaging: XRs of the lumbar spine from 01/19/2023 were independently reviewed and interpreted, showing no fracture or dislocation seen. No evidence of instability on  flexion/extension views. No significant degenerative changes.   MRI of the lumbar spine from 12/08/2021 was independently reviewed and interpreted, showing low lying conus.Conus terminates around L5/S1. No significant stenosis seen. No significant DDD.    Patient name: Kristina Larson Patient MRN: [id number] Date of visit: 01/19/23

## 2023-01-27 ENCOUNTER — Other Ambulatory Visit: Payer: Self-pay | Admitting: Physical Medicine and Rehabilitation

## 2023-02-24 ENCOUNTER — Other Ambulatory Visit: Payer: Self-pay | Admitting: Physical Medicine and Rehabilitation

## 2023-03-23 ENCOUNTER — Other Ambulatory Visit: Payer: Self-pay | Admitting: Physical Medicine and Rehabilitation

## 2023-03-24 ENCOUNTER — Other Ambulatory Visit: Payer: Self-pay | Admitting: Physical Medicine and Rehabilitation

## 2023-05-08 ENCOUNTER — Other Ambulatory Visit: Payer: Self-pay | Admitting: Physical Medicine and Rehabilitation

## 2023-06-01 ENCOUNTER — Other Ambulatory Visit: Payer: Self-pay | Admitting: Physical Medicine and Rehabilitation

## 2023-06-02 ENCOUNTER — Other Ambulatory Visit: Payer: Self-pay | Admitting: Physical Medicine and Rehabilitation

## 2023-06-06 ENCOUNTER — Other Ambulatory Visit: Payer: Self-pay | Admitting: Physical Medicine and Rehabilitation

## 2023-07-11 ENCOUNTER — Other Ambulatory Visit: Payer: Self-pay | Admitting: Physical Medicine and Rehabilitation

## 2023-08-23 ENCOUNTER — Other Ambulatory Visit: Payer: Self-pay | Admitting: Physical Medicine and Rehabilitation

## 2023-09-05 ENCOUNTER — Encounter: Payer: Self-pay | Admitting: Obstetrics and Gynecology

## 2023-09-05 ENCOUNTER — Other Ambulatory Visit: Payer: Self-pay | Admitting: Obstetrics and Gynecology

## 2023-09-05 DIAGNOSIS — Z1231 Encounter for screening mammogram for malignant neoplasm of breast: Secondary | ICD-10-CM

## 2023-09-21 ENCOUNTER — Ambulatory Visit
Admission: RE | Admit: 2023-09-21 | Discharge: 2023-09-21 | Disposition: A | Discharge status: Home / Self Care | Source: Ambulatory Visit | Attending: Obstetrics and Gynecology | Admitting: Obstetrics and Gynecology

## 2023-09-21 DIAGNOSIS — Z1231 Encounter for screening mammogram for malignant neoplasm of breast: Secondary | ICD-10-CM

## 2023-09-25 ENCOUNTER — Other Ambulatory Visit: Payer: Self-pay | Admitting: Physical Medicine and Rehabilitation

## 2023-12-04 ENCOUNTER — Other Ambulatory Visit: Payer: Self-pay | Admitting: Physical Medicine and Rehabilitation

## 2023-12-18 ENCOUNTER — Encounter: Payer: Self-pay | Admitting: Radiology

## 2024-02-20 ENCOUNTER — Other Ambulatory Visit: Payer: Self-pay | Admitting: Physical Medicine and Rehabilitation
